# Patient Record
Sex: Female | Born: 1995
Health system: Southern US, Community
[De-identification: ages and names within clinical notes are randomized; demographics above are authoritative.]

## PROBLEM LIST (undated history)

## (undated) DIAGNOSIS — A64 Unspecified sexually transmitted disease: Secondary | ICD-10-CM

---
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
## 2014-01-23 ENCOUNTER — Emergency Department (HOSPITAL_COMMUNITY): Payer: Medicaid Other

## 2014-01-23 ENCOUNTER — Encounter (HOSPITAL_COMMUNITY): Payer: Self-pay | Admitting: Emergency Medicine

## 2014-01-23 ENCOUNTER — Emergency Department (HOSPITAL_COMMUNITY)
Admission: EM | Admit: 2014-01-23 | Discharge: 2014-01-23 | Disposition: A | Discharge status: Home / Self Care | Payer: Medicaid Other | Attending: Emergency Medicine | Admitting: Emergency Medicine

## 2014-01-23 DIAGNOSIS — N83209 Unspecified ovarian cyst, unspecified side: Secondary | ICD-10-CM | POA: Insufficient documentation

## 2014-01-23 DIAGNOSIS — A499 Bacterial infection, unspecified: Secondary | ICD-10-CM | POA: Insufficient documentation

## 2014-01-23 DIAGNOSIS — Z3202 Encounter for pregnancy test, result negative: Secondary | ICD-10-CM | POA: Insufficient documentation

## 2014-01-23 DIAGNOSIS — R197 Diarrhea, unspecified: Secondary | ICD-10-CM | POA: Insufficient documentation

## 2014-01-23 DIAGNOSIS — N760 Acute vaginitis: Secondary | ICD-10-CM | POA: Insufficient documentation

## 2014-01-23 DIAGNOSIS — R1110 Vomiting, unspecified: Secondary | ICD-10-CM | POA: Insufficient documentation

## 2014-01-23 DIAGNOSIS — B9689 Other specified bacterial agents as the cause of diseases classified elsewhere: Secondary | ICD-10-CM | POA: Insufficient documentation

## 2014-01-23 DIAGNOSIS — F17200 Nicotine dependence, unspecified, uncomplicated: Secondary | ICD-10-CM | POA: Insufficient documentation

## 2014-01-23 LAB — BASIC METABOLIC PANEL
BUN: 11 mg/dL (ref 6–23)
CHLORIDE: 101 meq/L (ref 96–112)
CO2: 23 mEq/L (ref 19–32)
Calcium: 9.2 mg/dL (ref 8.4–10.5)
Creatinine, Ser: 0.62 mg/dL (ref 0.50–1.10)
GFR calc non Af Amer: >90 mL/min (ref >90)
Glucose, Bld: 89 mg/dL (ref 70–99)
POTASSIUM: 3.9 meq/L (ref 3.7–5.3)
SODIUM: 138 meq/L (ref 137–147)

## 2014-01-23 LAB — URINALYSIS, ROUTINE W REFLEX MICROSCOPIC
Bilirubin Urine: NEGATIVE
Glucose, UA: NEGATIVE mg/dL
Hgb urine dipstick: NEGATIVE
KETONES UR: NEGATIVE mg/dL
LEUKOCYTES UA: NEGATIVE
NITRITE: NEGATIVE
PH: 7 (ref 5.0–8.0)
Protein, ur: NEGATIVE mg/dL
SPECIFIC GRAVITY, URINE: 1.03 (ref 1.005–1.030)
Urobilinogen, UA: 1 mg/dL (ref 0.0–1.0)

## 2014-01-23 LAB — WET PREP, GENITAL
TRICH WET PREP: NONE SEEN
WBC, Wet Prep HPF POC: NONE SEEN
YEAST WET PREP: NONE SEEN

## 2014-01-23 LAB — CBC WITH DIFFERENTIAL/PLATELET
BASOS PCT: 1 % (ref 0–1)
Basophils Absolute: 0.1 10*3/uL (ref 0.0–0.1)
Eosinophils Absolute: 0.1 10*3/uL (ref 0.0–0.7)
Eosinophils Relative: 1 % (ref 0–5)
HCT: 40 % (ref 36.0–46.0)
HEMOGLOBIN: 13.6 g/dL (ref 12.0–15.0)
Lymphocytes Relative: 11 % — ABNORMAL LOW (ref 12–46)
Lymphs Abs: 1.3 10*3/uL (ref 0.7–4.0)
MCH: 31 pg (ref 26.0–34.0)
MCHC: 34 g/dL (ref 30.0–36.0)
MCV: 91.1 fL (ref 78.0–100.0)
MONOS PCT: 7 % (ref 3–12)
Monocytes Absolute: 0.8 10*3/uL (ref 0.1–1.0)
NEUTROS ABS: 8.9 10*3/uL — AB (ref 1.7–7.7)
NEUTROS PCT: 80 % — AB (ref 43–77)
Platelets: 341 10*3/uL (ref 150–400)
RBC: 4.39 MIL/uL (ref 3.87–5.11)
RDW: 12.8 % (ref 11.5–15.5)
WBC: 11.2 10*3/uL — ABNORMAL HIGH (ref 4.0–10.5)

## 2014-01-23 LAB — POCT PREGNANCY, URINE: Preg Test, Ur: NEGATIVE

## 2014-01-23 MED ORDER — HYDROCODONE-ACETAMINOPHEN 5-325 MG PO TABS: 1.0000 | ORAL_TABLET | ORAL | Status: DC | PRN

## 2014-01-23 MED ORDER — ONDANSETRON HCL 4 MG/2ML IJ SOLN
4.0000 mg | Freq: Once | INTRAMUSCULAR | Status: AC
  Administered 2014-01-23: 4 mg via INTRAVENOUS
  Filled 2014-01-23: qty 2

## 2014-01-23 MED ORDER — IOHEXOL 300 MG/ML  SOLN
100.0000 mL | Freq: Once | INTRAMUSCULAR | Status: AC | PRN
Start: 2014-01-23 — End: 2014-01-23
  Administered 2014-01-23: 100 mL via INTRAVENOUS

## 2014-01-23 MED ORDER — MORPHINE SULFATE 4 MG/ML IJ SOLN
4.0000 mg | Freq: Once | INTRAMUSCULAR | Status: AC
  Administered 2014-01-23: 4 mg via INTRAVENOUS
  Filled 2014-01-23: qty 1

## 2014-01-23 MED ORDER — METRONIDAZOLE 500 MG PO TABS: 500.0000 mg | ORAL_TABLET | Freq: Two times a day (BID) | ORAL | Status: DC

## 2014-01-23 NOTE — ED Provider Notes (Signed)
CSN: [id number]     Arrival date & time 01/23/14  1105 History   First MD Initiated Contact with Patient 01/23/14 1109     Chief Complaint  Patient presents with  . Abdominal Pain     (Consider location/radiation/quality/duration/timing/severity/associated sxs/prior Treatment) HPI Comments: Pt states that she started with llq pain with vomiting and diarrhea onset this morning. No fever. Pt states that he hasn't been able to keep anything down. No previous surgery. Pain is constant. No blood noted in diarrhea  The history is provided by the patient. No language interpreter was used.    Past Medical History  Diagnosis Date  . STD (female)    History reviewed. No pertinent past surgical history. History reviewed. No pertinent family history. History  Substance Use Topics  . Smoking status: Current Every Day Smoker    Types: Cigarettes  . Smokeless tobacco: Not on file  . Alcohol Use: No   OB History   Grav Para Term Preterm Abortions TAB SAB Ect Mult Living                 Review of Systems  Constitutional: Negative.   Respiratory: Negative.   Cardiovascular: Negative.       Allergies  Review of patient's allergies indicates no known allergies.  Home Medications   Current Outpatient Rx  Name  Route  Sig  Dispense  Refill  . loperamide (IMODIUM) 2 MG capsule   Oral   Take 4 mg by mouth as needed for diarrhea or loose stools.          BP 124/66  Pulse 93  Temp(Src) 98 F (36.7 C) (Oral)  Resp 22  Ht 5\' 3"  (1.6 m)  Wt 159 lb 8 oz (72.349 kg)  BMI 28.26 kg/m2  SpO2 98% Physical Exam  Nursing note and vitals reviewed. Constitutional: She is oriented to person, place, and time. She appears well-developed and well-nourished.  Cardiovascular: Normal rate.   Pulmonary/Chest: Effort normal and breath sounds normal.  Abdominal: Soft. Bowel sounds are normal. There is tenderness in the suprapubic area and left lower quadrant.  Genitourinary:  Thick white  discharge:bilateral tenderness  Musculoskeletal: Normal range of motion.  Neurological: She is alert and oriented to person, place, and time.  Skin: Skin is warm and dry.    ED Course  Procedures (including critical care time) Labs Review Labs Reviewed  WET PREP, GENITAL - Abnormal; Notable for the following:    Clue Cells Wet Prep HPF POC MODERATE (*)    All other components within normal limits  URINALYSIS, ROUTINE W REFLEX MICROSCOPIC - Abnormal; Notable for the following:    APPearance HAZY (*)    All other components within normal limits  CBC WITH DIFFERENTIAL - Abnormal; Notable for the following:    WBC 11.2 (*)    Neutrophils Relative % 80 (*)    Neutro Abs 8.9 (*)    Lymphocytes Relative 11 (*)    All other components within normal limits  GC/CHLAMYDIA PROBE AMP  BASIC METABOLIC PANEL  POCT PREGNANCY, URINE   Imaging Review Ct Abdomen Pelvis W Contrast  01/23/2014   CLINICAL DATA:  Left lower quadrant abdominal pain, vomiting and diarrhea  EXAM: CT ABDOMEN AND PELVIS WITH CONTRAST  TECHNIQUE: Multidetector CT imaging of the abdomen and pelvis was performed using the standard protocol following bolus administration of intravenous contrast.  CONTRAST:  OMNIPAQUE IOHEXOL 300 MG/ML  SOLN  COMPARISON:  US ABDOMEN COMPLETE dated 10/06/2009  FINDINGS: Normal  hepatic contour. No discrete hepatic lesions. Normal appearance of the gallbladder. No radiopaque gallstones. No intra or extrahepatic biliary duct dilatation. No ascites.  There is symmetric enhancement and excretion of bilateral kidneys. No definite renal stones on this postcontrast examination. No discrete renal lesions. No urinary destruction or perinephric stranding. Normal appearance of the bilateral adrenal glands, pancreas and spleen.  Ingestion enteric contrast extends to the level of the distal small bowel. No evidence of enteric obstruction. Normal appearance of the retrocecal appendix which is noted to course  cranially about the caudal aspect of the right lobe of the liver. No pneumoperitoneum, pneumatosis or portal venous gas.  Normal caliber the abdominal aorta. The major branch vessels of the abdominal aorta appear patent on this non CT examination. Incidental note is made of an a circum aortic left-sided renal vein. No retroperitoneal, mesenteric, pelvic or inguinal lymphadenopathy.  Note is made of an approximately 1.9 x 1.2 cm hypo attenuating (11 Hounsfield unit) presumed physiologic left-sided adnexal cyst (coronal image 60, series 6). There is a minimal amount of fluid (measuring approximately 10 Hounsfield units within the adjacent left side adnexa and pelvic cul-de-sac (axial image 70, series 12). There is a minimal amount of presumably physiologic fluid within the endometrial canal. No discrete right-sided adnexal lesions.  Limited visualization of the lower thorax demonstrates minimal bibasilar dependent ground-glass atelectasis, right greater than left. There is minimal subsegmental atelectasis from the caudal aspect of the right middle lobe. No discrete focal airspace opacities. No pleural effusion.  Normal heart size.  No pericardial effusion.  No acute or aggressive osseus abnormalities. Regional soft tissues appear normal.  IMPRESSION: 1. Approximately 1.9 cm left-sided adnexal cyst with minimal amount of presumed physiologic fluid within the adjacent left adnexa, pelvic cul-de-sac and endometrial canal. 2. Otherwise, no explanation for patient's left lower abdominal quadrant abdominal pain. Specifically, no evidence of enteric or urinary obstruction. Normal appearance of the appendix.   Electronically Signed   By: Simonne ComeJohn  Watts M.D.   On: 01/23/2014 15:59    EKG Interpretation   None       MDM   Final diagnoses:  BV (bacterial vaginosis)  Ovarian cyst  Vomiting and diarrhea    Pt is comfortable at this time:will treat with something for pain. Pt can follow up with pcp. Pt has not been  seen by gyn. Pt tolerating po without any problem    Teressa LowerVrinda Cobain Morici, NP 01/23/14 [phone]

## 2014-01-23 NOTE — ED Provider Notes (Signed)
Medical screening examination/treatment/procedure(s) were performed by non-physician practitioner and as supervising physician I was immediately available for consultation/collaboration.  EKG Interpretation   None         Shon Batonourtney F Tiah Heckel, MD 01/23/14 [phone]

## 2014-01-23 NOTE — ED Notes (Signed)
Pt completed oral contrast. CT notified

## 2014-01-23 NOTE — ED Notes (Signed)
Pt in c/o left lower abd pain that started this am with vomiting and diarrhea, pt tearful in triage

## 2014-01-23 NOTE — Discharge Instructions (Signed)
Bacterial Vaginosis Bacterial vaginosis is a vaginal infection that occurs when the normal balance of bacteria in the vagina is disrupted. It results from an overgrowth of certain bacteria. This is the most common vaginal infection in women of childbearing age. Treatment is important to prevent complications, especially in pregnant women, as it can cause a premature delivery. CAUSES  Bacterial vaginosis is caused by an increase in harmful bacteria that are normally present in smaller amounts in the vagina. Several different kinds of bacteria can cause bacterial vaginosis. However, the reason that the condition develops is not fully understood. RISK FACTORS Certain activities or behaviors can put you at an increased risk of developing bacterial vaginosis, including:  Having a new sex partner or multiple sex partners.  Douching.  Using an intrauterine device (IUD) for contraception. Women do not get bacterial vaginosis from toilet seats, bedding, swimming pools, or contact with objects around them. SIGNS AND SYMPTOMS  Some women with bacterial vaginosis have no signs or symptoms. Common symptoms include:  Grey vaginal discharge.  A fishlike odor with discharge, especially after sexual intercourse.  Itching or burning of the vagina and vulva.  Burning or pain with urination. DIAGNOSIS  Your health care provider will take a medical history and examine the vagina for signs of bacterial vaginosis. A sample of vaginal fluid may be taken. Your health care provider will look at this sample under a microscope to check for bacteria and abnormal cells. A vaginal pH test may also be done.  TREATMENT  Bacterial vaginosis may be treated with antibiotic medicines. These may be given in the form of a pill or a vaginal cream. A second round of antibiotics may be prescribed if the condition comes back after treatment.  HOME CARE INSTRUCTIONS   Only take over-the-counter or prescription medicines as  directed by your health care provider.  If antibiotic medicine was prescribed, take it as directed. Make sure you finish it even if you start to feel better.  Do not have sex until treatment is completed.  Tell all sexual partners that you have a vaginal infection. They should see their health care provider and be treated if they have problems, such as a mild rash or itching.  Practice safe sex by using condoms and only having one sex partner. SEEK MEDICAL CARE IF:   Your symptoms are not improving after 3 days of treatment.  You have increased discharge or pain.  You have a fever. MAKE SURE YOU:   Understand these instructions.  Will watch your condition.  Will get help right away if you are not doing well or get worse. FOR MORE INFORMATION  Centers for Disease Control and Prevention, Division of STD Prevention: SolutionApps.co.za American Sexual Health Association (ASHA): www.ashastd.org  Document Released: 11/26/2005 Document Revised: 09/16/2013 Document Reviewed: 07/08/2013 [facility] Patient Information 2014 [place], [place].  Ovarian Cyst An ovarian cyst is a sac filled with fluid or blood. This sac is attached to the ovary. Some cysts go away on their own. Other cysts need treatment.  HOME CARE   Only take medicine as told by your doctor.  Follow up with your doctor as told.  Get regular pelvic exams and Pap tests. GET HELP IF:  Your periods are late, not regular, or painful.  You stop having periods.  Your belly (abdominal) or pelvic pain does not go away.  Your belly becomes large or puffy (swollen).  You have a hard time peeing (totally emptying your bladder).  You have pressure on your  bladder.  You have pain during sex.  You feel fullness, pressure, or discomfort in your belly.  You lose weight for no reason.  You feel sick most of the time.  You have a hard time pooping (constipation).  You do not feel like eating.  You develop pimples  (acne).  You have an increase in hair on your body and face.  You are gaining weight for no reason.  You think you are pregnant. GET HELP RIGHT AWAY IF:   Your belly pain gets worse.  You feel sick to your stomach (nauseous), and you throw up (vomit).  You have a fever that comes on fast.  You have belly pain while pooping (bowel movement).  Your periods are heavier than usual. MAKE SURE YOU:   Understand these instructions.  Will watch your condition.  Will get help right away if you are not doing well or get worse. Document Released: 05/14/2008 Document Revised: 09/16/2013 Document Reviewed: 08/03/2013 [facility] Patient Information 2014 [place], [place].

## 2014-01-23 NOTE — ED Notes (Signed)
NP at bedside.

## 2014-01-25 LAB — GC/CHLAMYDIA PROBE AMP
CT Probe RNA: POSITIVE — AB
GC Probe RNA: POSITIVE — AB

## 2014-01-26 NOTE — ED Notes (Signed)
Chart sent to EDP office for review.+ Gonorrhea +Chlamydia

## 2014-01-28 NOTE — ED Notes (Signed)
Chart returned from EDP office  Rocephin 250 mg IM once as treatment for Gonorrhea Zithromax 1,000 mg po once as treatment for Chalmydia

## 2014-08-12 ENCOUNTER — Emergency Department (HOSPITAL_BASED_OUTPATIENT_CLINIC_OR_DEPARTMENT_OTHER)
Admission: EM | Admit: 2014-08-12 | Discharge: 2014-08-13 | Disposition: A | Discharge status: Home / Self Care | Payer: Medicaid Other | Attending: Emergency Medicine | Admitting: Emergency Medicine

## 2014-08-12 ENCOUNTER — Emergency Department (HOSPITAL_BASED_OUTPATIENT_CLINIC_OR_DEPARTMENT_OTHER): Payer: Medicaid Other

## 2014-08-12 ENCOUNTER — Encounter (HOSPITAL_BASED_OUTPATIENT_CLINIC_OR_DEPARTMENT_OTHER): Payer: Self-pay | Admitting: Emergency Medicine

## 2014-08-12 DIAGNOSIS — F17200 Nicotine dependence, unspecified, uncomplicated: Secondary | ICD-10-CM | POA: Insufficient documentation

## 2014-08-12 DIAGNOSIS — J069 Acute upper respiratory infection, unspecified: Secondary | ICD-10-CM | POA: Insufficient documentation

## 2014-08-12 DIAGNOSIS — R05 Cough: Secondary | ICD-10-CM | POA: Diagnosis present

## 2014-08-12 DIAGNOSIS — R059 Cough, unspecified: Secondary | ICD-10-CM | POA: Diagnosis present

## 2014-08-12 DIAGNOSIS — L509 Urticaria, unspecified: Secondary | ICD-10-CM | POA: Diagnosis not present

## 2014-08-12 DIAGNOSIS — Z8619 Personal history of other infectious and parasitic diseases: Secondary | ICD-10-CM | POA: Insufficient documentation

## 2014-08-12 DIAGNOSIS — Z79899 Other long term (current) drug therapy: Secondary | ICD-10-CM | POA: Insufficient documentation

## 2014-08-12 MED ORDER — ALBUTEROL SULFATE HFA 108 (90 BASE) MCG/ACT IN AERS
2.0000 | INHALATION_SPRAY | Freq: Once | RESPIRATORY_TRACT | Status: AC
  Administered 2014-08-12: 2 via RESPIRATORY_TRACT
  Filled 2014-08-12: qty 6.7

## 2014-08-12 NOTE — ED Provider Notes (Signed)
CSN: [id number]     Arrival date & time 08/12/14  2202 History   First MD Initiated Contact with Patient 08/12/14 2337     This chart was scribed for Joya Gaskins, MD by Arlan Organ, ED Scribe. This patient was seen in room MH09/MH09 and the patient's care was started 11:40 PM.   Chief Complaint  Patient presents with  . Cough  . Urticaria   The history is provided by the patient. No language interpreter was used.    HPI Comments: Chantella Creech is a 18 y.o. female who presents to the Emergency Department complaining of a constant, moderate  productive cough and nasal congestion x 2 days that is unchanged . Pt also reports hives to forearms that have been responsive to Benadryl. She has tried OTC medications for cold symptoms without any improvement for symptoms. She denies any SOB, fever, or chills. No history of asthma. No known allergies to medications. No other concerns this visit.  Past Medical History  Diagnosis Date  . STD (female)    History reviewed. No pertinent past surgical history. History reviewed. No pertinent family history. History  Substance Use Topics  . Smoking status: Current Every Day Smoker    Types: Cigarettes  . Smokeless tobacco: Not on file  . Alcohol Use: No   OB History   Grav Para Term Preterm Abortions TAB SAB Ect Mult Living                 Review of Systems  Constitutional: Negative for fever and chills.  HENT: Positive for congestion and rhinorrhea.   Respiratory: Positive for cough.   Gastrointestinal: Negative for nausea, vomiting and diarrhea.  All other systems reviewed and are negative.     Allergies  Review of patient's allergies indicates no known allergies.  Home Medications   Prior to Admission medications   Medication Sig Start Date End Date Taking? Authorizing Provider  HYDROcodone-acetaminophen (NORCO/VICODIN) 5-325 MG per tablet Take 1-2 tablets by mouth every 4 (four) hours as needed. 01/23/14   Teressa Lower, NP  loperamide (IMODIUM) 2 MG capsule Take 4 mg by mouth as needed for diarrhea or loose stools.    Historical Provider, MD  metroNIDAZOLE (FLAGYL) 500 MG tablet Take 1 tablet (500 mg total) by mouth 2 (two) times daily. 01/23/14   Teressa Lower, NP   Triage Vitals: BP 123/70  Pulse 96  Temp(Src) 98.4 F (36.9 C) (Oral)  Resp 20  Ht  (1.6 m)  Wt 161 lb (73.029 kg)  BMI 28.53 kg/m2  SpO2 98%  LMP 08/11/2014   Physical Exam  CONSTITUTIONAL: Well developed/well nourished HEAD: Normocephalic/atraumatic EYES: EOMI/PERRL ENMT: Mucous membranes moist NECK: supple no meningeal signs SPINE:entire spine nontender CV: S1/S2 noted, no murmurs/rubs/gallops noted LUNGS: Lungs are clear to auscultation bilaterally, no apparent distress ABDOMEN: soft, nontender, no rebound or guarding NEURO: Pt is awake/alert, moves all extremitiesx4 EXTREMITIES: pulses normal, full ROM SKIN: warm, color normal. No rash noted PSYCH: no abnormalities of mood noted   ED Course  Procedures   DIAGNOSTIC STUDIES: Oxygen Saturation is 98% on RA, Normal by my interpretation.    COORDINATION OF CARE: 11:38 PM- Will order DG chest 2 view. Will give albuterol. Discussed treatment plan with pt at bedside and pt agreed to plan.    Imaging Review Dg Chest 2 View  08/12/2014   CLINICAL DATA:  Cough, congestion.  EXAM: CHEST  2 VIEW  COMPARISON:  None.  FINDINGS: The heart size and  mediastinal contours are within normal limits. Both lungs are clear. The visualized skeletal structures are unremarkable.  IMPRESSION: No radiographic evidence of active cardiopulmonary disease.   Electronically Signed   By: Jearld Lesch M.D.   On: 08/12/2014 22:55      MDM   Final diagnoses:  URI (upper respiratory infection)    Nursing notes including past medical history and social history reviewed and considered in documentation xrays reviewed and considered   I personally performed the services described  in this documentation, which was scribed in my presence. The recorded information has been reviewed and is accurate.    Joya Gaskins, MD 08/13/14 [phone]

## 2014-08-12 NOTE — Discharge Instructions (Signed)
Cough, Adult   A cough is a reflex. It helps you clear your throat and airways. A cough can help heal your body. A cough can last 2 or 3 weeks (acute) or may last more than 8 weeks (chronic). Some common causes of a cough can include an infection, allergy, or a cold.  HOME CARE  · Only take medicine as told by your doctor.  · If given, take your medicines (antibiotics) as told. Finish them even if you start to feel better.  · Use a cold steam vaporizer or humidifier in your home. This can help loosen thick spit (secretions).  · Sleep so you are almost sitting up (semi-upright). Use pillows to do this. This helps reduce coughing.  · Rest as needed.  · Stop smoking if you smoke.  GET HELP RIGHT AWAY IF:  · You have yellowish-white fluid (pus) in your thick spit.  · Your cough gets worse.  · Your medicine does not reduce coughing, and you are losing sleep.  · You cough up blood.  · You have trouble breathing.  · Your pain gets worse and medicine does not help.  · You have a fever.  MAKE SURE YOU:   · Understand these instructions.  · Will watch your condition.  · Will get help right away if you are not doing well or get worse.  Document Released: 08/09/2011 Document Revised: 04/12/2014 Document Reviewed: 08/09/2011  ExitCare® Patient Information ©2015 ExitCare, LLC. This information is not intended to replace advice given to you by your health care provider. Make sure you discuss any questions you have with your health care provider.

## 2014-08-12 NOTE — ED Notes (Signed)
Patient states that she started to have a cough and rash yesterday after another family members came home from school sick

## 2014-08-22 ENCOUNTER — Emergency Department (HOSPITAL_COMMUNITY)
Admission: EM | Admit: 2014-08-22 | Discharge: 2014-08-22 | Disposition: A | Discharge status: Home / Self Care | Payer: Medicaid Other | Attending: Emergency Medicine | Admitting: Emergency Medicine

## 2014-08-22 ENCOUNTER — Encounter (HOSPITAL_COMMUNITY): Payer: Self-pay | Admitting: Emergency Medicine

## 2014-08-22 DIAGNOSIS — B379 Candidiasis, unspecified: Secondary | ICD-10-CM

## 2014-08-22 DIAGNOSIS — B373 Candidiasis of vulva and vagina: Secondary | ICD-10-CM | POA: Diagnosis not present

## 2014-08-22 DIAGNOSIS — Z3202 Encounter for pregnancy test, result negative: Secondary | ICD-10-CM | POA: Insufficient documentation

## 2014-08-22 DIAGNOSIS — N760 Acute vaginitis: Secondary | ICD-10-CM | POA: Diagnosis not present

## 2014-08-22 DIAGNOSIS — F17200 Nicotine dependence, unspecified, uncomplicated: Secondary | ICD-10-CM | POA: Diagnosis not present

## 2014-08-22 DIAGNOSIS — B9689 Other specified bacterial agents as the cause of diseases classified elsewhere: Secondary | ICD-10-CM

## 2014-08-22 DIAGNOSIS — L293 Anogenital pruritus, unspecified: Secondary | ICD-10-CM | POA: Insufficient documentation

## 2014-08-22 DIAGNOSIS — B3731 Acute candidiasis of vulva and vagina: Secondary | ICD-10-CM | POA: Diagnosis not present

## 2014-08-22 LAB — URINALYSIS, ROUTINE W REFLEX MICROSCOPIC
BILIRUBIN URINE: NEGATIVE
GLUCOSE, UA: NEGATIVE mg/dL
HGB URINE DIPSTICK: NEGATIVE
KETONES UR: NEGATIVE mg/dL
Nitrite: NEGATIVE
PH: 7 (ref 5.0–8.0)
Protein, ur: NEGATIVE mg/dL
Specific Gravity, Urine: 1.005 (ref 1.005–1.030)
Urobilinogen, UA: 0.2 mg/dL (ref 0.0–1.0)

## 2014-08-22 LAB — WET PREP, GENITAL: Trich, Wet Prep: NONE SEEN

## 2014-08-22 LAB — URINE MICROSCOPIC-ADD ON

## 2014-08-22 LAB — POC URINE PREG, ED: Preg Test, Ur: NEGATIVE

## 2014-08-22 MED ORDER — FLUCONAZOLE 200 MG PO TABS
200.0000 mg | ORAL_TABLET | Freq: Once | ORAL | Status: AC
  Administered 2014-08-22: 200 mg via ORAL
  Filled 2014-08-22: qty 1

## 2014-08-22 MED ORDER — FLUCONAZOLE 200 MG PO TABS: 200.0000 mg | ORAL_TABLET | Freq: Once | ORAL | Status: AC

## 2014-08-22 MED ORDER — METRONIDAZOLE 500 MG PO TABS: 500.0000 mg | ORAL_TABLET | Freq: Two times a day (BID) | ORAL | Status: DC

## 2014-08-22 NOTE — Discharge Instructions (Signed)
Take the prescribed medication as directed.  Do not drink alcohol while taking Flagyl, it will make you sick/vomit. You will be notified in 24/48 hours if culture results are positive or require treatment. Follow-up with women's clinic if symptoms persist. Return to the ED for new or worsening symptoms.

## 2014-08-22 NOTE — ED Notes (Signed)
Patient c/o vaginal itching. Patient reports increased urination. Denies vaginal discharge. Patient reports a history of unprotected sex. Requesting STD check.

## 2014-08-22 NOTE — ED Notes (Signed)
Patient is alert and oriented x3.  She was given DC instructions and follow up visit instructions.  Patient gave verbal understanding. She was DC ambulatory under her own power to home.  V/S stable.  He was not showing any signs of distress on DC 

## 2014-08-22 NOTE — ED Provider Notes (Signed)
Medical screening examination/treatment/procedure(s) were performed by non-physician practitioner and as supervising physician I was immediately available for consultation/collaboration.   EKG Interpretation None        Dorla Guizar F Byrd Terrero, MD 08/22/14 0710 

## 2014-08-22 NOTE — ED Provider Notes (Signed)
CSN: [id number]     Arrival date & time 08/22/14  0106 History   First MD Initiated Contact with Patient 08/22/14 0115     Chief Complaint  Patient presents with  . Vaginal Itching  . Exposure to STD    possible     (Consider location/radiation/quality/duration/timing/severity/associated sxs/prior Treatment) Patient is a 18 y.o. female presenting with vaginal itching and STD exposure. The history is provided by the patient and medical records.  Vaginal Itching  Exposure to STD  This is a 18 year old female with her my history of STD, presenting to the ED for vaginal itching. Patient states symptoms started yesterday, but worsened throughout the night making it hard for her to go to sleep. She denies any vaginal discharge, pelvic pain, or abdominal pain. She does note increased urination, but denies dysuria or urinary frequency. Patient is currently monogamous with one female sexual partner, they have been using condoms for protection however she does request an STD check.  No fever, chills, sweats, or rashes.  Past Medical History  Diagnosis Date  . STD (female)    History reviewed. No pertinent past surgical history. No family history on file. History  Substance Use Topics  . Smoking status: Current Every Day Smoker -- 0.25 packs/day    Types: Cigarettes  . Smokeless tobacco: Not on file  . Alcohol Use: No   OB History   Grav Para Term Preterm Abortions TAB SAB Ect Mult Living                 Review of Systems  Genitourinary: Positive for vaginal pain (itching).  All other systems reviewed and are negative.     Allergies  Review of patient's allergies indicates no known allergies.  Home Medications   Prior to Admission medications   Not on File   BP 138/75  Pulse 91  Temp(Src) 98 F (36.7 C) (Oral)  Resp 16  Ht  (1.6 m)  Wt 158 lb (71.668 kg)  BMI 28.00 kg/m2  SpO2 100%  LMP 08/08/2014  Physical Exam  Nursing note and vitals  reviewed. Constitutional: She is oriented to person, place, and time. She appears well-developed and well-nourished.  HENT:  Head: Normocephalic and atraumatic.  Mouth/Throat: Oropharynx is clear and moist.  Eyes: Conjunctivae and EOM are normal. Pupils are equal, round, and reactive to light.  Neck: Normal range of motion.  Cardiovascular: Normal rate, regular rhythm and normal heart sounds.   Pulmonary/Chest: Effort normal and breath sounds normal. No respiratory distress. She has no wheezes.  Abdominal: Soft. Bowel sounds are normal. There is no tenderness. There is no guarding.  Genitourinary: There is no tenderness or lesion on the right labia. There is no tenderness or lesion on the left labia. Cervix exhibits no motion tenderness. Right adnexum displays no tenderness. Left adnexum displays no tenderness. No bleeding around the vagina. No foreign body around the vagina. Vaginal discharge found.  Normal female external genitalia without visible lesions; moderate about of thick, curd like vaginal discharge present in vaginal vault consistent with yeast infection; cervical os closed; no adnexal or CMT  Musculoskeletal: Normal range of motion.  Neurological: She is alert and oriented to person, place, and time.  Skin: Skin is warm and dry.  Psychiatric: She has a normal mood and affect.    ED Course  Procedures (including critical care time) Labs Review Labs Reviewed  WET PREP, GENITAL - Abnormal; Notable for the following:    Yeast Wet Prep HPF POC MANY (*)  Clue Cells Wet Prep HPF POC MANY (*)    WBC, Wet Prep HPF POC MANY (*)    All other components within normal limits  URINALYSIS, ROUTINE W REFLEX MICROSCOPIC - Abnormal; Notable for the following:    APPearance CLOUDY (*)    Leukocytes, UA LARGE (*)    All other components within normal limits  URINE MICROSCOPIC-ADD ON - Abnormal; Notable for the following:    Squamous Epithelial / LPF FEW (*)    Bacteria, UA FEW (*)    All  other components within normal limits  GC/CHLAMYDIA PROBE AMP  POC URINE PREG, ED    Imaging Review No results found.   EKG Interpretation None      MDM   Final diagnoses:  Yeast infection  Bacterial vaginosis   18 year old female with vaginal itching. She denies any discharge. No abdominal pain, pelvic pain, fever, or chills. Increased urinary frequency without dysuria or hematuria. Currently monogamous with one female sexual partner, using condoms for protection.  On exam, patient afebrile and non-toxic appearing.  Abdominal exam is benign.  Pelvic exam with moderate amount of thick, curd-like vaginal discharge consistent with yeast infection. No adnexal or cervical motion tenderness. No masses or fullness appreciated.  Wet prep with many yeast and many clue cells. Gc/Chl pending. Patient will be treated with Diflucan and Flagyl. She was encouraged not to drink alcohol while taking Flagyl. She'll followup with women's clinic if symptoms persist.  Discussed plan with patient, he/she acknowledged understanding and agreed with plan of care.  Return precautions given for new or worsening symptoms.  Garlon Hatchet, PA-C 08/22/14 [phone]

## 2014-08-23 LAB — GC/CHLAMYDIA PROBE AMP
CT PROBE, AMP APTIMA: NEGATIVE
GC PROBE AMP APTIMA: NEGATIVE

## 2015-05-02 ENCOUNTER — Encounter (HOSPITAL_BASED_OUTPATIENT_CLINIC_OR_DEPARTMENT_OTHER): Payer: Self-pay | Admitting: Family Medicine

## 2015-05-02 ENCOUNTER — Emergency Department (HOSPITAL_BASED_OUTPATIENT_CLINIC_OR_DEPARTMENT_OTHER)
Admission: EM | Admit: 2015-05-02 | Discharge: 2015-05-02 | Disposition: A | Discharge status: Home / Self Care | Payer: Medicaid Other | Attending: Emergency Medicine | Admitting: Emergency Medicine

## 2015-05-02 DIAGNOSIS — Z8619 Personal history of other infectious and parasitic diseases: Secondary | ICD-10-CM | POA: Insufficient documentation

## 2015-05-02 DIAGNOSIS — Y998 Other external cause status: Secondary | ICD-10-CM | POA: Insufficient documentation

## 2015-05-02 DIAGNOSIS — S01312A Laceration without foreign body of left ear, initial encounter: Secondary | ICD-10-CM | POA: Insufficient documentation

## 2015-05-02 DIAGNOSIS — S0101XA Laceration without foreign body of scalp, initial encounter: Secondary | ICD-10-CM

## 2015-05-02 DIAGNOSIS — S0990XA Unspecified injury of head, initial encounter: Secondary | ICD-10-CM | POA: Insufficient documentation

## 2015-05-02 DIAGNOSIS — X58XXXA Exposure to other specified factors, initial encounter: Secondary | ICD-10-CM | POA: Insufficient documentation

## 2015-05-02 DIAGNOSIS — Z792 Long term (current) use of antibiotics: Secondary | ICD-10-CM | POA: Insufficient documentation

## 2015-05-02 DIAGNOSIS — Z720 Tobacco use: Secondary | ICD-10-CM | POA: Insufficient documentation

## 2015-05-02 DIAGNOSIS — S6991XA Unspecified injury of right wrist, hand and finger(s), initial encounter: Secondary | ICD-10-CM | POA: Insufficient documentation

## 2015-05-02 DIAGNOSIS — Y939 Activity, unspecified: Secondary | ICD-10-CM | POA: Insufficient documentation

## 2015-05-02 DIAGNOSIS — Y929 Unspecified place or not applicable: Secondary | ICD-10-CM | POA: Insufficient documentation

## 2015-05-02 DIAGNOSIS — M79644 Pain in right finger(s): Secondary | ICD-10-CM

## 2015-05-02 NOTE — Discharge Instructions (Signed)
Buddy Taping You have a minor finger or toe injury. It can be managed by buddy taping. Buddy taping means the injured finger or toe is taped to a healthy uninjured adjacent finger or toe. Most minor fractures and dislocations of the smaller fingers and toes will heal in 3 to 4 weeks. Buddy taping immobilizes and protects the area of injury. Buddy taping is not recommended for initial treatment of fractures of the thumb, longer fingers, or the great toe. Buddy taping should not be used for unstable or deformed fractures, but as fracture healing progresses it may be used for protection during rehabilitation. Fractured fingers and toes should be protected by buddy taping as long as the injury is still painful or swollen.  When an injury is buddy taped, place a small piece of gauze or cotton between the digits that are taped. This helps prevent the skin from breaking down from increased moisture. Buddy taping allows you to get your injury wet when you bathe. Change the gauze and tape more often if it gets wet, and dry the space between the finger or toes. Use a sturdy, hard-soled shoe for better support if you have a fractured toe. In 2 to 3 weeks you can start motion exercises. This will keep the fingers or toes from becoming stiff.  SEEK IMMEDIATE MEDICAL CARE IF:   The injured area becomes cold, numb, or pale.  You have pain not controlled with medications.  You notice increasing deformity of the toe or finger. Document Released: 01/03/2005 Document Revised: 02/18/2012 Document Reviewed: 05/04/2009 [facility] Patient Information 2015 [place], [place]. This information is not intended to replace advice given to you by your health care provider. Make sure you discuss any questions you have with your health care provider. Tissue Adhesive Wound Care Some cuts, wounds, lacerations, and incisions can be repaired by using tissue adhesive. Tissue adhesive is like glue. It holds the skin together, allowing for  faster healing. It forms a strong bond on the skin in about 1 minute and reaches its full strength in about 2 or 3 minutes. The adhesive disappears naturally while the wound is healing. It is important to take proper care of your wound at home while it heals.  HOME CARE INSTRUCTIONS   Showers are allowed. Do not soak the area containing the tissue adhesive. Do not take baths, swim, or use hot tubs. Do not use any soaps or ointments on the wound. Certain ointments can weaken the glue.  If a bandage (dressing) has been applied, follow your health care provider's instructions for how often to change the dressing.   Keep the dressing dry if one has been applied.   Do not scratch, pick, or rub the adhesive.   Do not place tape over the adhesive. The adhesive could come off when pulling the tape off.   Protect the wound from further injury until it is healed.   Protect the wound from sun and tanning bed exposure while it is healing and for several weeks after healing.   Only take over-the-counter or prescription medicines as directed by your health care provider.   Keep all follow-up appointments as directed by your health care provider. SEEK IMMEDIATE MEDICAL CARE IF:   Your wound becomes red, swollen, hot, or tender.   You develop a rash after the glue is applied.  You have increasing pain in the wound.   You have a red streak that goes away from the wound.   You have pus coming from the  wound.   You have increased bleeding.  You have a fever.  You have shaking chills.   You notice a bad smell coming from the wound.   Your wound or adhesive breaks open.  MAKE SURE YOU:   Understand these instructions.  Will watch your condition.  Will get help right away if you are not doing well or get worse. Document Released: 05/22/2001 Document Revised: 09/16/2013 Document Reviewed: 06/17/2013 [facility] Patient Information 2015 [place], [place]. This information is not  intended to replace advice given to you by your health care provider. Make sure you discuss any questions you have with your health care provider.
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
## 2015-05-02 NOTE — ED Provider Notes (Signed)
CSN: [id number]     Arrival date & time 05/02/15  [id number] History   First MD Initiated Contact with Patient 05/02/15 [phone]     Chief Complaint  Patient presents with  . Ear Laceration      HPI Patient reports or call intoxication last night.  She woke with a small laceration behind the left year.  She also presents with complaints of mild right-sided facial pain.  No headache at this time.  No trismus or malocclusion.  No jaw pain.  No eye complaints.  She also reports mild pain in her right little finger at the PIP joint with associated pain with flexion of the PIP joint.  Mild swelling noted.  No other complaints at this time.  No chest pain shortness breath.  No abdominal pain.   Past Medical History  Diagnosis Date  . STD (female)    History reviewed. No pertinent past surgical history. No family history on file. History  Substance Use Topics  . Smoking status: Current Every Day Smoker -- 0.25 packs/day    Types: Cigarettes  . Smokeless tobacco: Not on file  . Alcohol Use: Yes   OB History    No data available     Review of Systems  All other systems reviewed and are negative.     Allergies  Review of patient's allergies indicates no known allergies.  Home Medications   Prior to Admission medications   Medication Sig Start Date End Date Taking? Authorizing Provider  metroNIDAZOLE (FLAGYL) 500 MG tablet Take 1 tablet (500 mg total) by mouth 2 (two) times daily. 08/22/14   Garlon HatchetLisa M Sanders, PA-C   BP 124/73 mmHg  Pulse 90  Temp(Src) 98.5 F (36.9 C) (Oral)  Resp 18  Ht 5\' 3"  (1.6 m)  Wt 175 lb (79.379 kg)  BMI 31.01 kg/m2  SpO2 100%  LMP 03/30/2015 (Approximate) Physical Exam  Constitutional: She is oriented to person, place, and time. She appears well-developed and well-nourished.  HENT:  Small 1.5 cm laceration behind her left ear in the posterior radicular region.  No active bleeding.  Tissue adhesive applied.  Mild right temporal tenderness without swelling or  bruising.  I started her movements are intact.  No trismus or malocclusion.  Dentition is normal.  No tenderness over her nasal bridge or swelling of her nasal bridge noted.  No tenderness over her right second metacarpal.  Eyes: EOM are normal.  Neck: Normal range of motion.  Pulmonary/Chest: Effort normal.  Abdominal: She exhibits no distension.  Musculoskeletal: Normal range of motion.  Mild swelling and pain with range of motion of the right little finger PIP joint.  Able to flex and extend the right finger normally.  No signs of infection.  Neurological: She is alert and oriented to person, place, and time.  Psychiatric: She has a normal mood and affect.  Nursing note and vitals reviewed.   ED Course  Procedures (including critical care time)  LACERATION REPAIR Performed by: Lyanne CoAMPOS,Jaxsyn Catalfamo M Consent: Verbal consent obtained. Risks and benefits: risks, benefits and alternatives were discussed Patient identity confirmed: provided demographic data Time out performed prior to procedure Prepped and Draped in normal sterile fashion Wound explored Laceration Location: left posterior auricular region Laceration Length: 1.5cm No Foreign Bodies seen or palpated Anesthesia:none Amount of cleaning: standard Skin closure: Tissue adhesive  Number of sutures or staples: Tissue adhesive  Technique: Tissue adhesive  Patient tolerance: Patient tolerated the procedure well with no immediate complications.  SPLINT APPLICATION Authorized by:  Alean Kromer M Consent: Verbal consent obtained. Risks and benefits: risks, benefits and alternatives were discussed Consent given by: patient Splint applied by: myself Location details: right little finger Splint type: buddy tape Supplies used: athletic tape Post-procedure: The splinted body part was neurovascularly unchanged following the procedure. Patient tolerance: Patient tolerated the procedure well with no immediate complications.      Labs  Review Labs Reviewed - No data to display  Imaging Review No results found.   EKG Interpretation None      MDM   Final diagnoses:  Scalp laceration, initial encounter  Finger pain, right  Minor head injury, initial encounter    Laceration repaired.  Buddy taping of the right little finger.  Likely represent small avulsion off of her proximal or middle phalanx of her right little finger.  No indication for x-ray.  Simple buddy taping should resolve and improve symptoms.  No indication for imaging of the head.  Reason anticoagulant.    Azalia Bilis, MD 05/02/15 [phone]

## 2015-05-17 ENCOUNTER — Emergency Department (HOSPITAL_COMMUNITY)
Admission: EM | Admit: 2015-05-17 | Discharge: 2015-05-17 | Disposition: A | Discharge status: Home / Self Care | Payer: Medicaid Other | Attending: Emergency Medicine | Admitting: Emergency Medicine

## 2015-05-17 ENCOUNTER — Encounter (HOSPITAL_COMMUNITY): Payer: Self-pay | Admitting: Physical Medicine and Rehabilitation

## 2015-05-17 ENCOUNTER — Emergency Department (HOSPITAL_COMMUNITY): Payer: Medicaid Other

## 2015-05-17 DIAGNOSIS — Z720 Tobacco use: Secondary | ICD-10-CM | POA: Insufficient documentation

## 2015-05-17 DIAGNOSIS — Z3202 Encounter for pregnancy test, result negative: Secondary | ICD-10-CM | POA: Insufficient documentation

## 2015-05-17 DIAGNOSIS — R110 Nausea: Secondary | ICD-10-CM | POA: Insufficient documentation

## 2015-05-17 DIAGNOSIS — Z792 Long term (current) use of antibiotics: Secondary | ICD-10-CM | POA: Insufficient documentation

## 2015-05-17 DIAGNOSIS — R42 Dizziness and giddiness: Secondary | ICD-10-CM | POA: Insufficient documentation

## 2015-05-17 DIAGNOSIS — R102 Pelvic and perineal pain: Secondary | ICD-10-CM | POA: Insufficient documentation

## 2015-05-17 LAB — COMPREHENSIVE METABOLIC PANEL
ALBUMIN: 4.1 g/dL (ref 3.5–5.0)
ALT: 17 U/L (ref 14–54)
ANION GAP: 8 (ref 5–15)
AST: 23 U/L (ref 15–41)
Alkaline Phosphatase: 51 U/L (ref 38–126)
CO2: 26 mmol/L (ref 22–32)
CREATININE: 0.95 mg/dL (ref 0.44–1.00)
Calcium: 9 mg/dL (ref 8.9–10.3)
Chloride: 105 mmol/L (ref 101–111)
GFR calc Af Amer: >60 mL/min (ref >60)
GFR calc non Af Amer: >60 mL/min (ref >60)
Glucose, Bld: 95 mg/dL (ref 65–99)
Potassium: 4.9 mmol/L (ref 3.5–5.1)
Sodium: 139 mmol/L (ref 135–145)
TOTAL PROTEIN: 6.9 g/dL (ref 6.5–8.1)
Total Bilirubin: 0.4 mg/dL (ref 0.3–1.2)

## 2015-05-17 LAB — URINALYSIS, ROUTINE W REFLEX MICROSCOPIC
Glucose, UA: NEGATIVE mg/dL
Hgb urine dipstick: NEGATIVE
Ketones, ur: 15 mg/dL — AB
Nitrite: NEGATIVE
PH: 6.5 (ref 5.0–8.0)
Protein, ur: NEGATIVE mg/dL
Specific Gravity, Urine: 1.037 — ABNORMAL HIGH (ref 1.005–1.030)
Urobilinogen, UA: 1 mg/dL (ref 0.0–1.0)

## 2015-05-17 LAB — CBC WITH DIFFERENTIAL/PLATELET
BASOS ABS: 0 10*3/uL (ref 0.0–0.1)
BASOS PCT: 1 % (ref 0–1)
Eosinophils Absolute: 0.1 10*3/uL (ref 0.0–0.7)
Eosinophils Relative: 2 % (ref 0–5)
HCT: 37.9 % (ref 36.0–46.0)
Hemoglobin: 12.8 g/dL (ref 12.0–15.0)
LYMPHS ABS: 1.5 10*3/uL (ref 0.7–4.0)
Lymphocytes Relative: 22 % (ref 12–46)
MCH: 30.8 pg (ref 26.0–34.0)
MCHC: 33.8 g/dL (ref 30.0–36.0)
MCV: 91.1 fL (ref 78.0–100.0)
MONOS PCT: 11 % (ref 3–12)
Monocytes Absolute: 0.7 10*3/uL (ref 0.1–1.0)
NEUTROS ABS: 4.6 10*3/uL (ref 1.7–7.7)
Neutrophils Relative %: 64 % (ref 43–77)
PLATELETS: 333 10*3/uL (ref 150–400)
RBC: 4.16 MIL/uL (ref 3.87–5.11)
RDW: 12.8 % (ref 11.5–15.5)
WBC: 7 10*3/uL (ref 4.0–10.5)

## 2015-05-17 LAB — WET PREP, GENITAL
Trich, Wet Prep: NONE SEEN
Yeast Wet Prep HPF POC: NONE SEEN

## 2015-05-17 LAB — LIPASE, BLOOD: Lipase: 16 U/L — ABNORMAL LOW (ref 22–51)

## 2015-05-17 LAB — URINE MICROSCOPIC-ADD ON

## 2015-05-17 LAB — POC URINE PREG, ED: Preg Test, Ur: NEGATIVE

## 2015-05-17 MED ORDER — KETOROLAC TROMETHAMINE 60 MG/2ML IM SOLN
30.0000 mg | Freq: Once | INTRAMUSCULAR | Status: AC
  Administered 2015-05-17: 30 mg via INTRAMUSCULAR
  Filled 2015-05-17: qty 2

## 2015-05-17 NOTE — ED Notes (Signed)
Pt A&OX4, ambulatory at d/c with steady gait, NAD 

## 2015-05-17 NOTE — ED Provider Notes (Signed)
CSN: [id number]     Arrival date & time 05/17/15  1244 History  This chart was scribed for non-physician practitioner, [facility] M. Damian Leavell, NP working with Pricilla Loveless, MD by Doreatha Martin, ED scribe. This patient was seen in room TR03C/TR03C and the patient's care was started at 2:21 PM    Chief Complaint  Patient presents with  . Abdominal Pain   Patient is a 19 y.o. female presenting with abdominal pain. The history is provided by the patient. No language interpreter was used.  Abdominal Pain Pain location:  RLQ Pain quality: sharp   Pain radiates to:  Does not radiate Pain severity:  Moderate Onset quality:  Gradual Progression:  Improving Relieved by:  Lying down Worsened by:  Movement Ineffective treatments:  None tried Associated symptoms: nausea   Associated symptoms: no chills, no fever, no vaginal bleeding, no vaginal discharge and no vomiting     HPI Comments: Megan Hahn is a 19 y.o. Go with Hx of left ovarian cyst and gas build-up problems who presents to the Emergency Department complaining of sudden onset, moderate, improving sharp RLQ abdominal 5/10 pain onset 1030 this morning. Pt reports associated one episode nausea, dizziness, and light-headedness due to the pain that has since resolved. Pt states that pain is worsened with movement to a 9/10 and relieved by rest back to a 5/10. LNMP was 04/24/15. Never had pap-smear, no contraceptive use. Pt is currently sexually active with last sexual intercourse 4 days ago. She denies Hx of appendectomy. She also denies vaginal bleeding, vaginal discharge, vomiting, fever, and chills.   Past Medical History  Diagnosis Date  . STD (female)    History reviewed. No pertinent past surgical history. No family history on file. History  Substance Use Topics  . Smoking status: Current Every Day Smoker -- 0.25 packs/day    Types: Cigarettes  . Smokeless tobacco: Not on file  . Alcohol Use: Yes   OB History    No data available      Review of Systems  Constitutional: Negative for fever and chills.  Gastrointestinal: Positive for nausea and abdominal pain. Negative for vomiting.  Genitourinary: Negative for vaginal bleeding and vaginal discharge.  Neurological: Positive for dizziness and light-headedness.  All other systems reviewed and are negative.  Allergies  Review of patient's allergies indicates no known allergies.  Home Medications   Prior to Admission medications   Medication Sig Start Date End Date Taking? Authorizing Provider  metroNIDAZOLE (FLAGYL) 500 MG tablet Take 1 tablet (500 mg total) by mouth 2 (two) times daily. 08/22/14   Garlon Hatchet, PA-C   BP 112/56 mmHg  Pulse 70  Temp(Src) 98 F (36.7 C) (Oral)  Resp 16  Ht  (1.6 m)  Wt 179 lb 3.2 oz (81.285 kg)  BMI 31.75 kg/m2  SpO2 100%  LMP 03/30/2015 (Approximate) Physical Exam  Constitutional: She is oriented to person, place, and time. She appears well-developed and well-nourished. No distress.  HENT:  Head: Normocephalic and atraumatic.  Eyes: EOM are normal.  Neck: Normal range of motion. Neck supple. No tracheal deviation present.  Cardiovascular: Normal rate, regular rhythm and normal heart sounds.  Exam reveals no gallop and no friction rub.   No murmur heard. Pulmonary/Chest: Effort normal and breath sounds normal. No respiratory distress.  Abdominal: Soft. Bowel sounds are normal. She exhibits no distension. There is tenderness in the right lower quadrant. There is no rigidity, no rebound and no guarding.  No CVA tenderness.  Genitourinary:  External genitalia without lesions. Mucous d/c vaginal vault. No CMT, right adnexal tenderness, uterus without palpable enlargement.   Musculoskeletal: Normal range of motion. She exhibits no edema or tenderness.  Neurological: She is alert and oriented to person, place, and time.  Skin: Skin is warm and dry.  Psychiatric: She has a normal mood and affect. Her behavior is normal.   Nursing note and vitals reviewed.   ED Course  Procedures (including critical care time) DIAGNOSTIC STUDIES: Oxygen Saturation is 100% on RA, normal by my interpretation.    COORDINATION OF CARE: 2:30 PM Discussed treatment plan with pt at bedside which includes [place] of pelvis and pt agreed to plan.   Labs Review Results for orders placed or performed during the hospital encounter of 05/17/15 (from the past 24 hour(s))  CBC with Differential     Status: None   Collection Time: 05/17/15  2:00 PM  Result Value Ref Range   WBC 7.0 4.0 - 10.5 K/uL   RBC 4.16 3.87 - 5.11 MIL/uL   Hemoglobin 12.8 12.0 - 15.0 g/dL   HCT 54.0 98.1 - 19.1 %   MCV 91.1 78.0 - 100.0 fL   MCH 30.8 26.0 - 34.0 pg   MCHC 33.8 30.0 - 36.0 g/dL   RDW 47.8 29.5 - 62.1 %   Platelets 333 150 - 400 K/uL   Neutrophils Relative % 64 43 - 77 %   Neutro Abs 4.6 1.7 - 7.7 K/uL   Lymphocytes Relative 22 12 - 46 %   Lymphs Abs 1.5 0.7 - 4.0 K/uL   Monocytes Relative 11 3 - 12 %   Monocytes Absolute 0.7 0.1 - 1.0 K/uL   Eosinophils Relative 2 0 - 5 %   Eosinophils Absolute 0.1 0.0 - 0.7 K/uL   Basophils Relative 1 0 - 1 %   Basophils Absolute 0.0 0.0 - 0.1 K/uL  Comprehensive metabolic panel     Status: Abnormal   Collection Time: 05/17/15  2:00 PM  Result Value Ref Range   Sodium 139 135 - 145 mmol/L   Potassium 4.9 3.5 - 5.1 mmol/L   Chloride 105 101 - 111 mmol/L   CO2 26 22 - 32 mmol/L   Glucose, Bld 95 65 - 99 mg/dL   BUN <5 (L) 6 - 20 mg/dL   Creatinine, Ser 3.08 0.44 - 1.00 mg/dL   Calcium 9.0 8.9 - 65.7 mg/dL   Total Protein 6.9 6.5 - 8.1 g/dL   Albumin 4.1 3.5 - 5.0 g/dL   AST 23 15 - 41 U/L   ALT 17 14 - 54 U/L   Alkaline Phosphatase 51 38 - 126 U/L   Total Bilirubin 0.4 0.3 - 1.2 mg/dL   GFR calc non Af Amer >60 >60 mL/min   GFR calc Af Amer >60 >60 mL/min   Anion gap 8 5 - 15  Lipase, blood     Status: Abnormal   Collection Time: 05/17/15  2:00 PM  Result Value Ref Range   Lipase 16 (L) 22 -  51 U/L  Urinalysis, Routine w reflex microscopic (not at [facility])     Status: Abnormal   Collection Time: 05/17/15  2:22 PM  Result Value Ref Range   Color, Urine AMBER (A) YELLOW   APPearance CLOUDY (A) CLEAR   Specific Gravity, Urine 1.037 (H) 1.005 - 1.030   pH 6.5 5.0 - 8.0   Glucose, UA NEGATIVE NEGATIVE mg/dL   Hgb urine dipstick NEGATIVE NEGATIVE   Bilirubin  Urine SMALL (A) NEGATIVE   Ketones, ur 15 (A) NEGATIVE mg/dL   Protein, ur NEGATIVE NEGATIVE mg/dL   Urobilinogen, UA 1.0 0.0 - 1.0 mg/dL   Nitrite NEGATIVE NEGATIVE   Leukocytes, UA SMALL (A) NEGATIVE  Urine microscopic-add on     Status: Abnormal   Collection Time: 05/17/15  2:22 PM  Result Value Ref Range   Squamous Epithelial / LPF MANY (A) RARE   WBC, UA 3-6 <3 WBC/hpf   RBC / HPF 0-2 <3 RBC/hpf   Bacteria, UA FEW (A) RARE   Urine-Other MUCOUS PRESENT   POC Urine Pregnancy, ED ([facility])     Status: None   Collection Time: 05/17/15  2:26 PM  Result Value Ref Range   Preg Test, Ur NEGATIVE NEGATIVE  Wet prep, genital     Status: Abnormal   Collection Time: 05/17/15  2:44 PM  Result Value Ref Range   Yeast Wet Prep HPF POC NONE SEEN NONE SEEN   Trich, Wet Prep NONE SEEN NONE SEEN   Clue Cells Wet Prep HPF POC FEW (A) NONE SEEN   WBC, Wet Prep HPF POC MANY (A) NONE SEEN     Imaging Review [place] Transvaginal Non-ob  05/17/2015   CLINICAL DATA:  Right lower quadrant pain.  Onset this morning.  EXAM: TRANSABDOMINAL AND TRANSVAGINAL ULTRASOUND OF PELVIS  TECHNIQUE: Both transabdominal and transvaginal ultrasound examinations of the pelvis were performed. Transabdominal technique was performed for global imaging of the pelvis including uterus, ovaries, adnexal regions, and pelvic cul-de-sac. It was necessary to proceed with endovaginal exam following the transabdominal exam to visualize the uterus, endometrium and ovaries.  COMPARISON:  CT 01/23/2014  FINDINGS: Uterus  Measurements: 8.5 x 4.3 x 5.1 cm. No fibroids  or other mass visualized. Normal anteverted position of the uterus.  Endometrium  Thickness: 0.9 cm.  No focal abnormality visualized.  Right ovary  Measurements: 4.3 x 1.8 x 2.8 cm. Normal appearance/no adnexal mass.  Left ovary  Measurements: 3.9 x 1.8 x 2.0 cm. Normal appearance/no adnexal mass.  Other findings  Small amount of free fluid.  This free fluid could be physiologic.  IMPRESSION: Normal pelvic ultrasound.   Electronically Signed   By: Richarda OverlieAdam  Henn M.D.   On: 05/17/2015 17:09   [place] Pelvis Complete  05/17/2015   CLINICAL DATA:  Right lower quadrant pain.  Onset this morning.  EXAM: TRANSABDOMINAL AND TRANSVAGINAL ULTRASOUND OF PELVIS  TECHNIQUE: Both transabdominal and transvaginal ultrasound examinations of the pelvis were performed. Transabdominal technique was performed for global imaging of the pelvis including uterus, ovaries, adnexal regions, and pelvic cul-de-sac. It was necessary to proceed with endovaginal exam following the transabdominal exam to visualize the uterus, endometrium and ovaries.  COMPARISON:  CT 01/23/2014  FINDINGS: Uterus  Measurements: 8.5 x 4.3 x 5.1 cm. No fibroids or other mass visualized. Normal anteverted position of the uterus.  Endometrium  Thickness: 0.9 cm.  No focal abnormality visualized.  Right ovary  Measurements: 4.3 x 1.8 x 2.8 cm. Normal appearance/no adnexal mass.  Left ovary  Measurements: 3.9 x 1.8 x 2.0 cm. Normal appearance/no adnexal mass.  Other findings  Small amount of free fluid.  This free fluid could be physiologic.  IMPRESSION: Normal pelvic ultrasound.   Electronically Signed   By: Richarda OverlieAdam  Henn M.D.   On: 05/17/2015 17:09   Toradol 30 mg. IM  MDM  19 y.o. female with pelvic pain that has resolved since arrival to the ED. Stable for  d/c without pain. No concern for acute abdomen. Discussed with the patient and all questioned fully answered. She will follow up at [facility].   Final diagnoses:  Pelvic pain in female   I  personally performed the services described in this documentation, which was scribed in my presence. The recorded information has been reviewed and is accurate.    [address] [place], [place] 05/18/15 0454  Pricilla Loveless, MD 05/21/15 [phone]

## 2015-05-17 NOTE — ED Notes (Signed)
This RN called US to determine delay in transporting to US and reports she is next to go.

## 2015-05-17 NOTE — ED Notes (Signed)
Pt presents to department for evaluation of R sided abdominal pain. Onset this afternoon while at work. 7/10 pain upon arrival to ED.

## 2015-05-17 NOTE — Discharge Instructions (Signed)
Abdominal Pain, Women °Abdominal (stomach, pelvic, or belly) pain can be caused by many things. It is important to tell your doctor: °· The location of the pain. °· Does it come and go or is it present all the time? °· Are there things that start the pain (eating certain foods, exercise)? °· Are there other symptoms associated with the pain (fever, nausea, vomiting, diarrhea)? °All of this is helpful to know when trying to find the cause of the pain. °CAUSES  °· Stomach: virus or bacteria infection, or ulcer. °· Intestine: appendicitis (inflamed appendix), regional ileitis (Crohn's disease), ulcerative colitis (inflamed colon), irritable bowel syndrome, diverticulitis (inflamed diverticulum of the colon), or cancer of the stomach or intestine. °· Gallbladder disease or stones in the gallbladder. °· Kidney disease, kidney stones, or infection. °· Pancreas infection or cancer. °· Fibromyalgia (pain disorder). °· Diseases of the female organs: °¨ Uterus: fibroid (non-cancerous) tumors or infection. °¨ Fallopian tubes: infection or tubal pregnancy. °¨ Ovary: cysts or tumors. °¨ Pelvic adhesions (scar tissue). °¨ Endometriosis (uterus lining tissue growing in the pelvis and on the pelvic organs). °¨ Pelvic congestion syndrome (female organs filling up with blood just before the menstrual period). °¨ Pain with the menstrual period. °¨ Pain with ovulation (producing an egg). °¨ Pain with an IUD (intrauterine device, birth control) in the uterus. °¨ Cancer of the female organs. °· Functional pain (pain not caused by a disease, may improve without treatment). °· Psychological pain. °· Depression. °DIAGNOSIS  °Your doctor will decide the seriousness of your pain by doing an examination. °· Blood tests. °· X-rays. °· Ultrasound. °· CT scan (computed tomography, special type of X-ray). °· MRI (magnetic resonance imaging). °· Cultures, for infection. °· Barium enema (dye inserted in the large intestine, to better view it with  X-rays). °· Colonoscopy (looking in intestine with a lighted tube). °· Laparoscopy (minor surgery, looking in abdomen with a lighted tube). °· Major abdominal exploratory surgery (looking in abdomen with a large incision). °TREATMENT  °The treatment will depend on the cause of the pain.  °· Many cases can be observed and treated at home. °· Over-the-counter medicines recommended by your caregiver. °· Prescription medicine. °· Antibiotics, for infection. °· Birth control pills, for painful periods or for ovulation pain. °· Hormone treatment, for endometriosis. °· Nerve blocking injections. °· Physical therapy. °· Antidepressants. °· Counseling with a psychologist or psychiatrist. °· Minor or major surgery. °HOME CARE INSTRUCTIONS  °· Do not take laxatives, unless directed by your caregiver. °· Take over-the-counter pain medicine only if ordered by your caregiver. Do not take aspirin because it can cause an upset stomach or bleeding. °· Try a clear liquid diet (broth or water) as ordered by your caregiver. Slowly move to a bland diet, as tolerated, if the pain is related to the stomach or intestine. °· Have a thermometer and take your temperature several times a day, and record it. °· Bed rest and sleep, if it helps the pain. °· Avoid sexual intercourse, if it causes pain. °· Avoid stressful situations. °· Keep your follow-up appointments and tests, as your caregiver orders. °· If the pain does not go away with medicine or surgery, you may try: °¨ Acupuncture. °¨ Relaxation exercises (yoga, meditation). °¨ Group therapy. °¨ Counseling. °SEEK MEDICAL CARE IF:  °· You notice certain foods cause stomach pain. °· Your home care treatment is not helping your pain. °· You need stronger pain medicine. °· You want your IUD removed. °· You feel faint or   lightheaded. °· You develop nausea and vomiting. °· You develop a rash. °· You are having side effects or an allergy to your medicine. °SEEK IMMEDIATE MEDICAL CARE IF:  °· Your  pain does not go away or gets worse. °· You have a fever. °· Your pain is felt only in portions of the abdomen. The right side could possibly be appendicitis. The left lower portion of the abdomen could be colitis or diverticulitis. °· You are passing blood in your stools (bright red or black tarry stools, with or without vomiting). °· You have blood in your urine. °· You develop chills, with or without a fever. °· You pass out. °MAKE SURE YOU:  °· Understand these instructions. °· Will watch your condition. °· Will get help right away if you are not doing well or get worse. °Document Released: 09/23/2007 Document Revised: 04/12/2014 Document Reviewed: 10/13/2009 °[employer]® Patient Information ©2015 [employer]. This information is not intended to replace advice given to you by your health care provider. Make sure you discuss any questions you have with your health care provider. ° °

## 2015-05-18 LAB — GC/CHLAMYDIA PROBE AMP (~~LOC~~) NOT AT ARMC
Chlamydia: POSITIVE — AB
NEISSERIA GONORRHEA: NEGATIVE

## 2015-05-19 ENCOUNTER — Telehealth (HOSPITAL_COMMUNITY): Payer: Self-pay

## 2015-05-19 NOTE — ED Notes (Signed)
Positive for chlamydia. Chart sent to edp office for review 

## 2015-05-20 ENCOUNTER — Telehealth: Payer: Self-pay | Admitting: *Deleted

## 2015-05-20 ENCOUNTER — Telehealth: Payer: Self-pay | Admitting: Emergency Medicine

## 2015-05-20 NOTE — Telephone Encounter (Signed)
Post ED Visit - Positive Culture Follow-up: Successful Patient Follow-Up   Positive Chlamydia culture  [x]  Patient discharged without antimicrobial prescription and treatment is now indicated []  Organism is resistant to prescribed ED discharge antimicrobial []  Patient with positive blood cultures  Changes discussed with ED provider: Jerelyn Scott, MD New antibiotic prescription: Azithromycin one gram PO x once Called to [facility] [phone]  Contacted patient, date 05/20/15, time 1752  ID verified, patient notified of positive Chlamydia and need for treatment. STD instructions provided, patient verbalized understanding. RX Azithromycin called to [facility] [phone]  Jiles Harold 05/20/2015, 5:54 PM

## 2015-05-23 IMAGING — CT CT ABD-PELV W/ CM
2 of 7 series · 15 of 46 positions shown, 17 images · IV contrast (APPLIED)
Comparison: US ABDOMEN COMPLETE dated 10/06/2009

CLINICAL DATA: Left lower quadrant abdominal pain, vomiting and
diarrhea

EXAM:
CT ABDOMEN AND PELVIS WITH CONTRAST
TECHNIQUE: Multidetector CT imaging of the abdomen and pelvis was performed
using the standard protocol following bolus administration of
intravenous contrast.
CONTRAST:  100mL OMNIPAQUE IOHEXOL 300 MG/ML  SOLN

[Series 6: coronals · coronal · 0.54mm/px · 3 of 101 slices shown]
[im 21/101  soft-tissue]
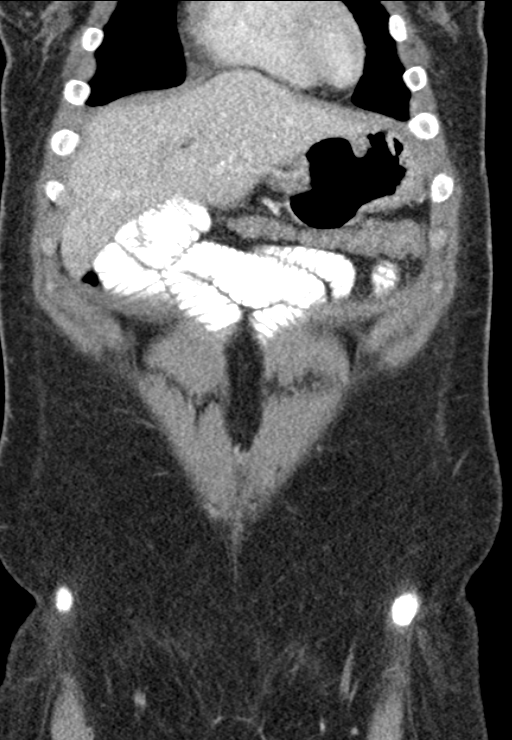
[im 41/101  soft-tissue]
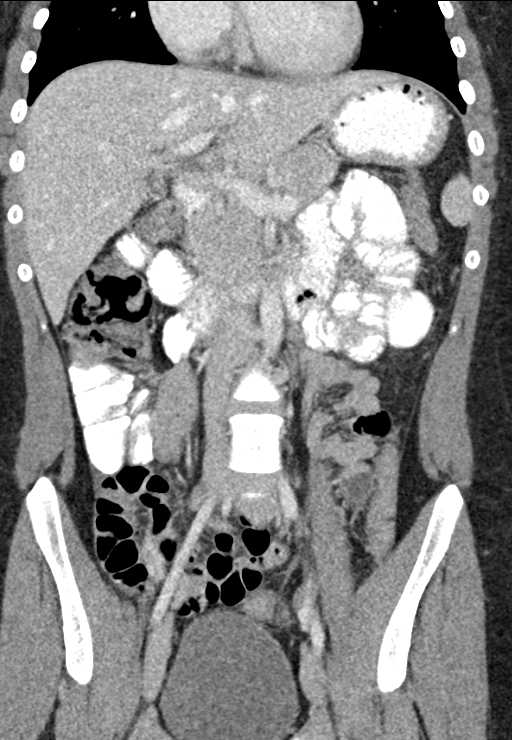
[im 61/101  soft-tissue]
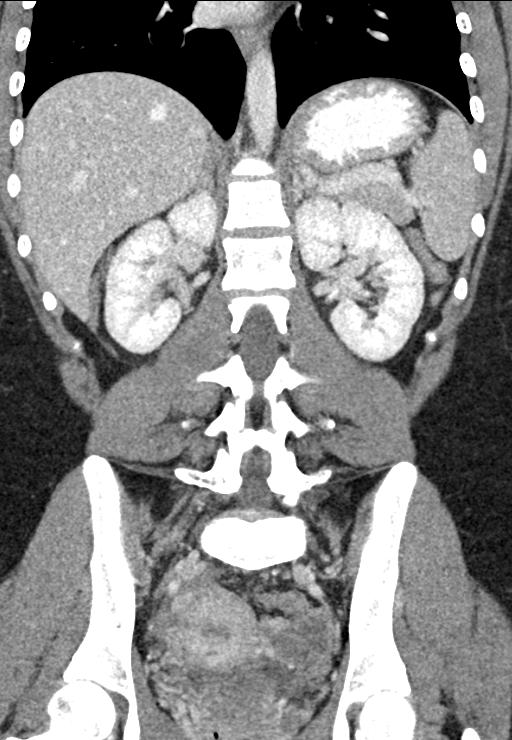

[Series 12: abd/ pelvis 5.0 i30f 1 · axial · 0.58mm/px · z∈[-322,+32]mm · 12 of 79 slices shown, 14 images]
[im 4/79  soft-tissue]
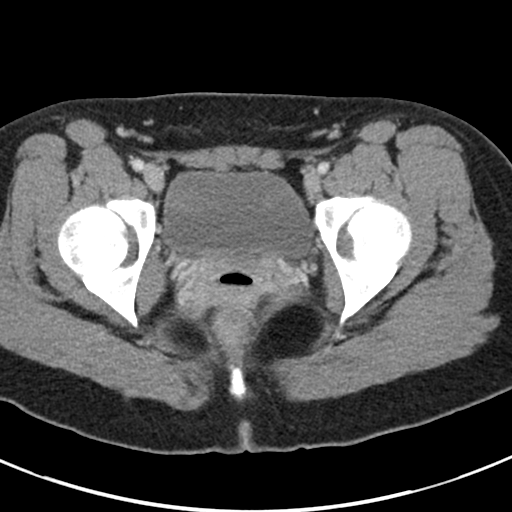
[im 4/79  bone]
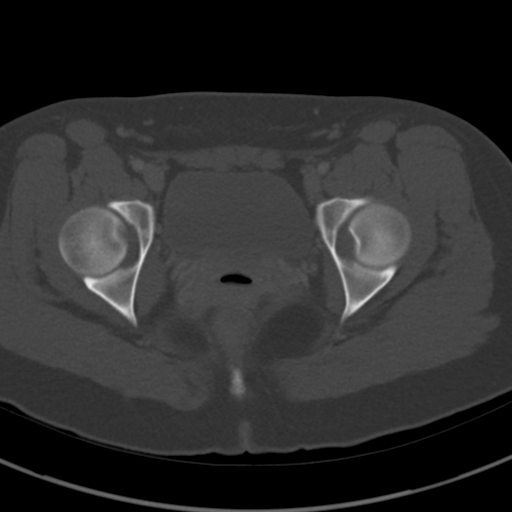
[im 12/79  soft-tissue]
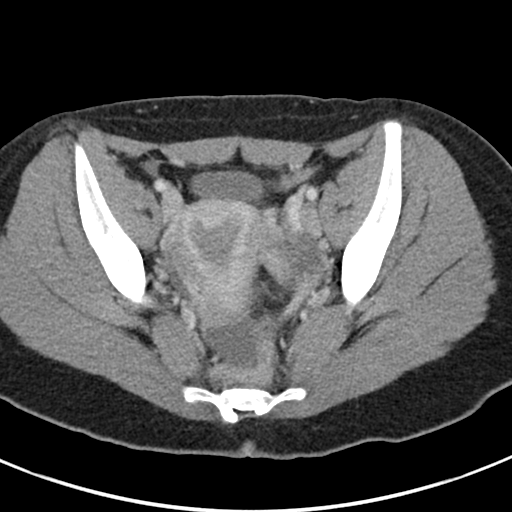
[im 19/79  soft-tissue]
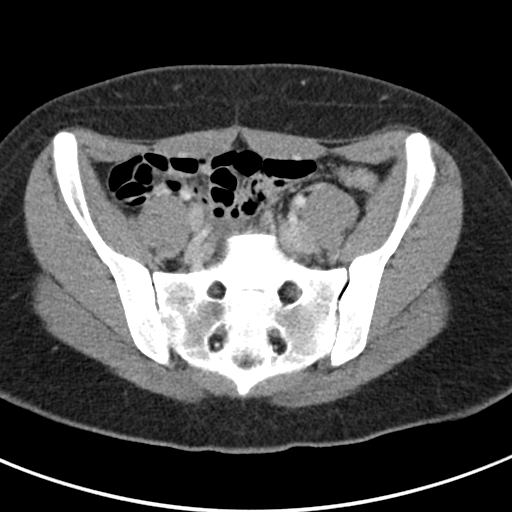
[im 23/79  soft-tissue]
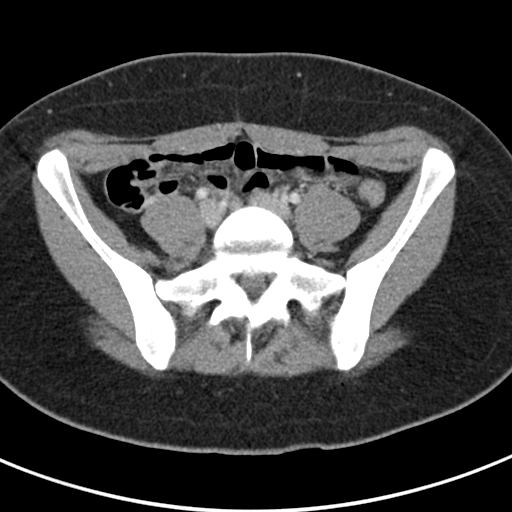
[im 30/79  soft-tissue]
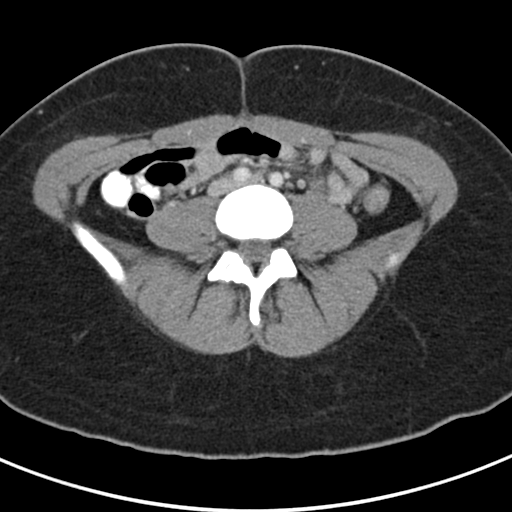
[im 38/79  soft-tissue]
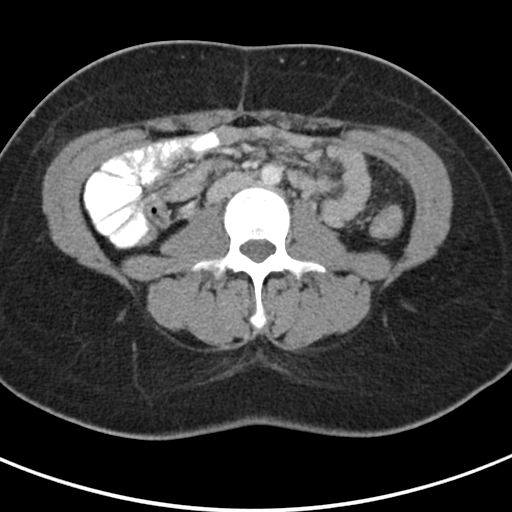
[im 41/79  soft-tissue]
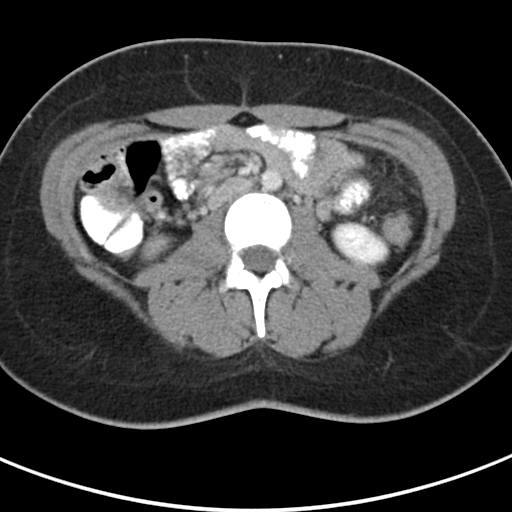
[im 49/79  soft-tissue]
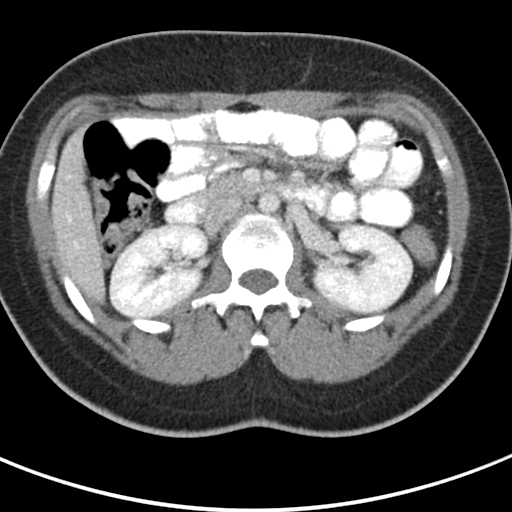
[im 56/79  soft-tissue]
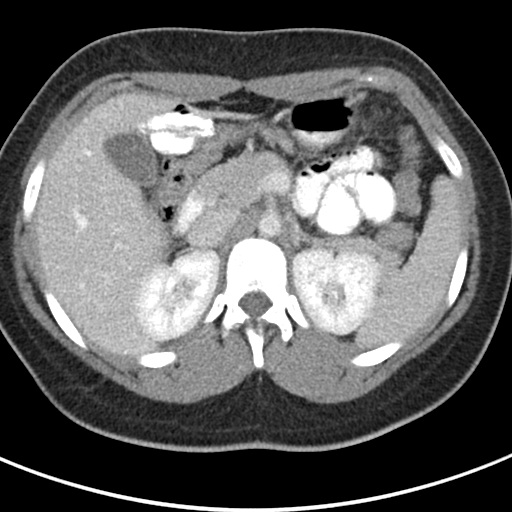
[im 56/79  bone]
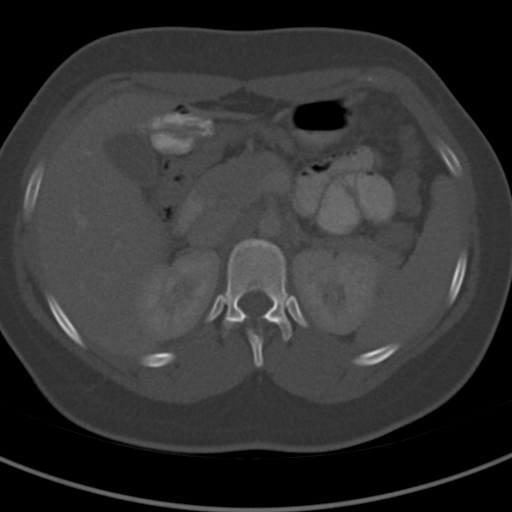
[im 60/79  soft-tissue]
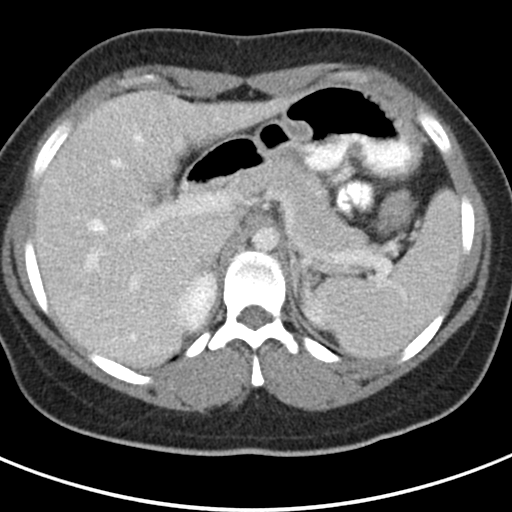
[im 67/79  soft-tissue]
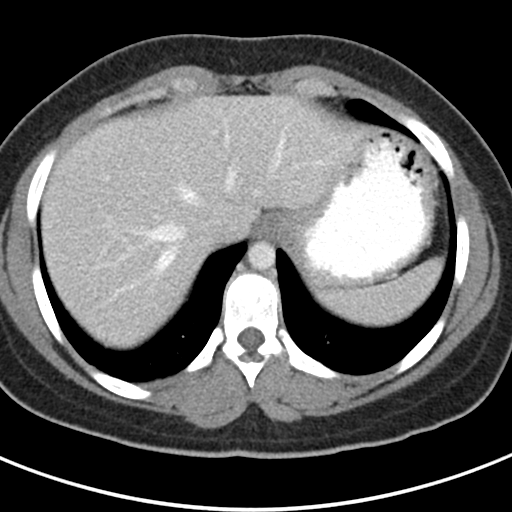
[im 75/79  soft-tissue]
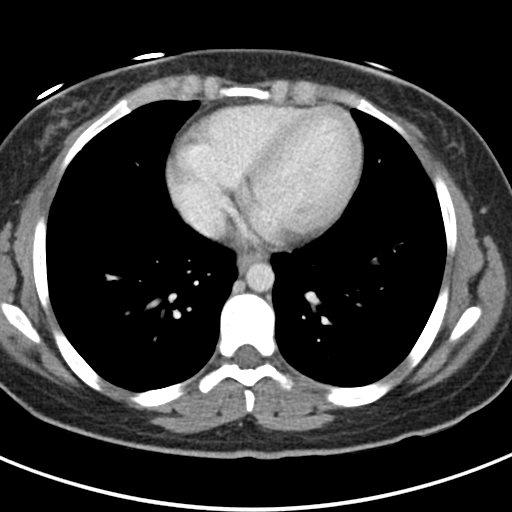

[15 of 46 positions shown; findings below may reference images not displayed]

FINDINGS: Normal hepatic contour. No discrete hepatic lesions. Normal
appearance of the gallbladder. No radiopaque gallstones. No intra or
extrahepatic biliary duct dilatation. No ascites.

There is symmetric enhancement and excretion of bilateral kidneys.
No definite renal stones on this postcontrast examination. No
discrete renal lesions. No urinary destruction or perinephric
stranding. Normal appearance of the bilateral adrenal glands,
pancreas and spleen.

Ingestion enteric contrast extends to the level of the distal small
bowel. No evidence of enteric obstruction. Normal appearance of the
retrocecal appendix which is noted to course cranially about the
caudal aspect of the right lobe of the liver. No pneumoperitoneum,
pneumatosis or portal venous gas.

Normal caliber the abdominal aorta. The major branch vessels of the
abdominal aorta appear patent on this non CT examination. Incidental
note is made of an a circum aortic left-sided renal vein. No
retroperitoneal, mesenteric, pelvic or inguinal lymphadenopathy.

Note is made of an approximately 1.9 x 1.2 cm hypo attenuating (11
Hounsfield unit) presumed physiologic left-sided adnexal cyst
(coronal image 60, series 6). There is a minimal amount of fluid
(measuring approximately 10 Hounsfield units within the adjacent
left side adnexa and pelvic cul-de-sac (axial image 70, series 12).
There is a minimal amount of presumably physiologic fluid within the
endometrial canal. No discrete right-sided adnexal lesions.

Limited visualization of the lower thorax demonstrates minimal
bibasilar dependent ground-glass atelectasis, right greater than
left. There is minimal subsegmental atelectasis from the caudal
aspect of the right middle lobe. No discrete focal airspace
opacities. No pleural effusion.

Normal heart size.  No pericardial effusion.

No acute or aggressive osseus abnormalities. Regional soft tissues
appear normal.
IMPRESSION: 1. Approximately 1.9 cm left-sided adnexal cyst with minimal amount
of presumed physiologic fluid within the adjacent left adnexa,
pelvic cul-de-sac and endometrial canal.
2. Otherwise, no explanation for patient's left lower abdominal
quadrant abdominal pain. Specifically, no evidence of enteric or
urinary obstruction. Normal appearance of the appendix.

## 2016-01-19 ENCOUNTER — Emergency Department (HOSPITAL_BASED_OUTPATIENT_CLINIC_OR_DEPARTMENT_OTHER)
Admission: EM | Admit: 2016-01-19 | Discharge: 2016-01-19 | Disposition: A | Discharge status: Home / Self Care | Payer: Medicaid Other | Attending: Emergency Medicine | Admitting: Emergency Medicine

## 2016-01-19 ENCOUNTER — Encounter (HOSPITAL_BASED_OUTPATIENT_CLINIC_OR_DEPARTMENT_OTHER): Payer: Self-pay | Admitting: *Deleted

## 2016-01-19 DIAGNOSIS — R1030 Lower abdominal pain, unspecified: Secondary | ICD-10-CM

## 2016-01-19 DIAGNOSIS — Z8619 Personal history of other infectious and parasitic diseases: Secondary | ICD-10-CM | POA: Insufficient documentation

## 2016-01-19 DIAGNOSIS — N760 Acute vaginitis: Secondary | ICD-10-CM | POA: Insufficient documentation

## 2016-01-19 DIAGNOSIS — Z3202 Encounter for pregnancy test, result negative: Secondary | ICD-10-CM | POA: Insufficient documentation

## 2016-01-19 DIAGNOSIS — F17210 Nicotine dependence, cigarettes, uncomplicated: Secondary | ICD-10-CM | POA: Insufficient documentation

## 2016-01-19 DIAGNOSIS — B9689 Other specified bacterial agents as the cause of diseases classified elsewhere: Secondary | ICD-10-CM

## 2016-01-19 DIAGNOSIS — Z79899 Other long term (current) drug therapy: Secondary | ICD-10-CM | POA: Insufficient documentation

## 2016-01-19 LAB — URINE MICROSCOPIC-ADD ON

## 2016-01-19 LAB — BASIC METABOLIC PANEL
Anion gap: 9 (ref 5–15)
BUN: 11 mg/dL (ref 6–20)
CHLORIDE: 107 mmol/L (ref 101–111)
CO2: 21 mmol/L — ABNORMAL LOW (ref 22–32)
CREATININE: 0.72 mg/dL (ref 0.44–1.00)
Calcium: 8.8 mg/dL — ABNORMAL LOW (ref 8.9–10.3)
GFR calc Af Amer: >60 mL/min (ref >60)
GFR calc non Af Amer: >60 mL/min (ref >60)
Glucose, Bld: 96 mg/dL (ref 65–99)
Potassium: 3.6 mmol/L (ref 3.5–5.1)
Sodium: 137 mmol/L (ref 135–145)

## 2016-01-19 LAB — CBC WITH DIFFERENTIAL/PLATELET
Basophils Absolute: 0 10*3/uL (ref 0.0–0.1)
Basophils Relative: 0 %
Eosinophils Absolute: 0.1 10*3/uL (ref 0.0–0.7)
Eosinophils Relative: 2 %
HEMATOCRIT: 38.9 % (ref 36.0–46.0)
HEMOGLOBIN: 13 g/dL (ref 12.0–15.0)
LYMPHS ABS: 1.5 10*3/uL (ref 0.7–4.0)
Lymphocytes Relative: 22 %
MCH: 30.4 pg (ref 26.0–34.0)
MCHC: 33.4 g/dL (ref 30.0–36.0)
MCV: 90.9 fL (ref 78.0–100.0)
MONOS PCT: 9 %
Monocytes Absolute: 0.6 10*3/uL (ref 0.1–1.0)
NEUTROS ABS: 4.6 10*3/uL (ref 1.7–7.7)
NEUTROS PCT: 67 %
Platelets: 363 10*3/uL (ref 150–400)
RBC: 4.28 MIL/uL (ref 3.87–5.11)
RDW: 12.9 % (ref 11.5–15.5)
WBC: 6.8 10*3/uL (ref 4.0–10.5)

## 2016-01-19 LAB — URINALYSIS, ROUTINE W REFLEX MICROSCOPIC
Bilirubin Urine: NEGATIVE
GLUCOSE, UA: NEGATIVE mg/dL
Ketones, ur: NEGATIVE mg/dL
Leukocytes, UA: NEGATIVE
Nitrite: NEGATIVE
PROTEIN: NEGATIVE mg/dL
Specific Gravity, Urine: 1.031 — ABNORMAL HIGH (ref 1.005–1.030)
pH: 6 (ref 5.0–8.0)

## 2016-01-19 LAB — RAPID HIV SCREEN (HIV 1/2 AB+AG)
HIV 1/2 Antibodies: NONREACTIVE
HIV-1 P24 Antigen - HIV24: NONREACTIVE

## 2016-01-19 LAB — WET PREP, GENITAL
SPERM: NONE SEEN
Trich, Wet Prep: NONE SEEN
Yeast Wet Prep HPF POC: NONE SEEN

## 2016-01-19 LAB — PREGNANCY, URINE: PREG TEST UR: NEGATIVE

## 2016-01-19 MED ORDER — CEFTRIAXONE SODIUM 250 MG IJ SOLR
250.0000 mg | Freq: Once | INTRAMUSCULAR | Status: AC
  Administered 2016-01-19: 250 mg via INTRAMUSCULAR
  Filled 2016-01-19: qty 250

## 2016-01-19 MED ORDER — METRONIDAZOLE 500 MG PO TABS: 500.0000 mg | ORAL_TABLET | Freq: Two times a day (BID) | ORAL | Status: DC

## 2016-01-19 MED ORDER — AZITHROMYCIN 250 MG PO TABS
1000.0000 mg | ORAL_TABLET | Freq: Once | ORAL | Status: AC
  Administered 2016-01-19: 1000 mg via ORAL
  Filled 2016-01-19: qty 4

## 2016-01-19 MED ORDER — LIDOCAINE HCL (PF) 1 % IJ SOLN
INTRAMUSCULAR | Status: AC
  Administered 2016-01-19: 2.1 mL
  Filled 2016-01-19: qty 5

## 2016-01-19 MED FILL — metroNIDAZOLE 500 MG TABS: 500 | 7 days supply | Qty: 14 | Fill #0

## 2016-01-19 NOTE — ED Notes (Signed)
Lower abdominal pain x 2 weeks. Vaginal itching and odor.

## 2016-01-19 NOTE — ED Provider Notes (Signed)
CSN: [id number]     Arrival date & time 01/19/16  1102 History   First MD Initiated Contact with Patient 01/19/16 1128     Chief Complaint  Patient presents with  . Abdominal Pain     (Consider location/radiation/quality/duration/timing/severity/associated sxs/prior Treatment) HPI 20 year old female who presents with abdominal pain. History of ovarian cysts and prior STDs. States she had unprotected sexual intercourse with potential STD exposure one month ago. Reports 2 weeks of low abdominal pain, localized to suprapubic abdomen, intermittent in nature.  Having increased vaginal itchiness. Reports vaginal discharge but without discoloration or abnormal odor. No dysuria, urinary frequency, nausea, vomiting, fever, chills, diarrhea.   Past Medical History  Diagnosis Date  . STD (female)    History reviewed. No pertinent past surgical history. History reviewed. No pertinent family history. Social History  Substance Use Topics  . Smoking status: Current Every Day Smoker -- 0.25 packs/day    Types: Cigarettes  . Smokeless tobacco: None  . Alcohol Use: Yes   OB History    No data available     Review of Systems 10/14 systems reviewed and are negative other than those stated in the HPI    Allergies  Review of patient's allergies indicates no known allergies.  Home Medications   Prior to Admission medications   Medication Sig Start Date End Date Taking? Authorizing Provider  metroNIDAZOLE (FLAGYL) 500 MG tablet Take 1 tablet (500 mg total) by mouth 2 (two) times daily. 08/22/14   Garlon Hatchet, PA-C  metroNIDAZOLE (FLAGYL) 500 MG tablet Take 1 tablet (500 mg total) by mouth 2 (two) times daily. 01/19/16   Lavera Guise, MD   BP 130/63 mmHg  Pulse 68  Temp(Src) 98.4 F (36.9 C) (Oral)  Resp 20  Ht  (1.6 m)  Wt 179 lb (81.194 kg)  BMI 31.72 kg/m2  SpO2 100%  LMP 01/05/2016 Physical Exam Physical Exam  Nursing note and vitals reviewed. Constitutional: Well developed,  well nourished, non-toxic, and in no acute distress Head: Normocephalic and atraumatic.  Mouth/Throat: Oropharynx is clear and moist.  Neck: Normal range of motion. Neck supple.  Cardiovascular: Normal rate and regular rhythm.   Pulmonary/Chest: Effort normal and breath sounds normal.  Abdominal: Soft. There is mild suprapubic tenderness. There is no rebound and no guarding. No CVA tenderness. Pelvic: Normal external genitalia. Normal internal genitalia. Copious white vaginal discharge. No blood within the vagina. No cervical motion tenderness. No adnexal masses or tenderness. Musculoskeletal: Normal range of motion.  Neurological: Alert, no facial droop, fluent speech, moves all extremities symmetrically Skin: Skin is warm and dry.  Psychiatric: Cooperative  ED Course  Procedures (including critical care time) Labs Review Labs Reviewed  WET PREP, GENITAL - Abnormal; Notable for the following:    Clue Cells Wet Prep HPF POC PRESENT (*)    WBC, Wet Prep HPF POC MODERATE (*)    All other components within normal limits  URINALYSIS, ROUTINE W REFLEX MICROSCOPIC (NOT AT [facility]) - Abnormal; Notable for the following:    APPearance CLOUDY (*)    Specific Gravity, Urine 1.031 (*)    Hgb urine dipstick SMALL (*)    All other components within normal limits  URINE MICROSCOPIC-ADD ON - Abnormal; Notable for the following:    Squamous Epithelial / LPF 6-30 (*)    Bacteria, UA FEW (*)    All other components within normal limits  BASIC METABOLIC PANEL - Abnormal; Notable for the following:    CO2  21 (*)    Calcium 8.8 (*)    All other components within normal limits  PREGNANCY, URINE  CBC WITH DIFFERENTIAL/PLATELET  RPR  RAPID HIV SCREEN (HIV 1/2 AB+AG)  GC/CHLAMYDIA PROBE AMP ([place]) NOT AT [facility]    Imaging Review No results found. I have personally reviewed and evaluated these images and lab results as part of my medical decision-making.   EKG Interpretation None      MDM    Final diagnoses:  Bacterial vaginosis  Lower abdominal pain    20 year old female, otherwise healthy without prior abdominal surgeries, who presents with suprapubic abdominal pain. He is well-appearing in no acute distress on presentation. Vital signs are within normal limits. She is a soft and benign abdomen, with focal suprapubic tenderness. Pelvic exam without cervical motion tenderness or adnexal tenderness to suggest PID or ovarian pathology, respectively. Basic blood work unremarkable. Wet prep revealing evidence of bacterial vaginosis for which she is given a course of Flagyl. She has requested empiric treatment for STDs, and received IM ceftriaxone and a gram of azithromycin. I have reviewed strict return and follow-up instructions. She expressed understanding of all discharge instructions and felt comfortable with the plan of care.    Lavera Guise, MD 01/19/16 1249

## 2016-01-19 NOTE — Discharge Instructions (Signed)
Take antibiotics as prescribed. You are also treated for potential chlamydia and gonorrhea, but the cultures will not come back until 2-3 days. Return without fail for worsening symptoms, including fever, worsening pain, vomiting unable to keep food or fluids, or any other symptoms concerning to you.  Bacterial Vaginosis Bacterial vaginosis is an infection of the vagina. It happens when too many germs (bacteria) grow in the vagina. Having this infection puts you at risk for getting other infections from sex. Treating this infection can help lower your risk for other infections, such as:   Chlamydia.  Gonorrhea.  HIV.  Herpes. HOME CARE  Take your medicine as told by your doctor.  Finish your medicine even if you start to feel better.  Tell your sex partner that you have an infection. They should see their doctor for treatment.  During treatment:  Avoid sex or use condoms correctly.  Do not douche.  Do not drink alcohol unless your doctor tells you it is ok.  Do not breastfeed unless your doctor tells you it is ok. GET HELP IF:  You are not getting better after 3 days of treatment.  You have more grey fluid (discharge) coming from your vagina than before.  You have more pain than before.  You have a fever. MAKE SURE YOU:   Understand these instructions.  Will watch your condition.  Will get help right away if you are not doing well or get worse.   This information is not intended to replace advice given to you by your health care provider. Make sure you discuss any questions you have with your health care provider.   Document Released: 09/04/2008 Document Revised: 12/17/2014 Document Reviewed: 07/08/2013 Elsevier Interactive Patient Education 2016 [employer]  Abdominal Pain, Adult Many things can cause belly (abdominal) pain. Most times, the belly pain is not dangerous. Many cases of belly pain can be watched and treated at home. HOME CARE   Do not take  medicines that help you go poop (laxatives) unless told to by your doctor.  Only take medicine as told by your doctor.  Eat or drink as told by your doctor. Your doctor will tell you if you should be on a special diet. GET HELP IF:  You do not know what is causing your belly pain.  You have belly pain while you are sick to your stomach (nauseous) or have runny poop (diarrhea).  You have pain while you pee or poop.  Your belly pain wakes you up at night.  You have belly pain that gets worse or better when you eat.  You have belly pain that gets worse when you eat fatty foods.  You have a fever. GET HELP RIGHT AWAY IF:   The pain does not go away within 2 hours.  You keep throwing up (vomiting).  The pain changes and is only in the right or left part of the belly.  You have bloody or tarry looking poop. MAKE SURE YOU:   Understand these instructions.  Will watch your condition.  Will get help right away if you are not doing well or get worse.   This information is not intended to replace advice given to you by your health care provider. Make sure you discuss any questions you have with your health care provider.   Document Released: 05/14/2008 Document Revised: 12/17/2014 Document Reviewed: 08/05/2013 Elsevier Interactive Patient Education [employer]

## 2016-01-20 LAB — GC/CHLAMYDIA PROBE AMP (~~LOC~~) NOT AT ARMC
CHLAMYDIA, DNA PROBE: NEGATIVE
NEISSERIA GONORRHEA: NEGATIVE

## 2016-01-20 LAB — RPR: RPR: NONREACTIVE

## 2016-02-25 ENCOUNTER — Encounter (HOSPITAL_BASED_OUTPATIENT_CLINIC_OR_DEPARTMENT_OTHER): Payer: Self-pay | Admitting: *Deleted

## 2016-02-25 ENCOUNTER — Emergency Department (HOSPITAL_BASED_OUTPATIENT_CLINIC_OR_DEPARTMENT_OTHER)
Admission: EM | Admit: 2016-02-25 | Discharge: 2016-02-25 | Disposition: A | Discharge status: Home / Self Care | Payer: Medicaid Other | Attending: Emergency Medicine | Admitting: Emergency Medicine

## 2016-02-25 DIAGNOSIS — B349 Viral infection, unspecified: Secondary | ICD-10-CM

## 2016-02-25 DIAGNOSIS — J40 Bronchitis, not specified as acute or chronic: Secondary | ICD-10-CM

## 2016-02-25 DIAGNOSIS — F17210 Nicotine dependence, cigarettes, uncomplicated: Secondary | ICD-10-CM | POA: Insufficient documentation

## 2016-02-25 DIAGNOSIS — Z8619 Personal history of other infectious and parasitic diseases: Secondary | ICD-10-CM | POA: Insufficient documentation

## 2016-02-25 MED ORDER — ALBUTEROL SULFATE HFA 108 (90 BASE) MCG/ACT IN AERS
2.0000 | INHALATION_SPRAY | Freq: Once | RESPIRATORY_TRACT | Status: AC
  Administered 2016-02-25: 2 via RESPIRATORY_TRACT
  Filled 2016-02-25: qty 6.7

## 2016-02-25 NOTE — Discharge Instructions (Signed)
Return to the ED with any concerns including difficulty breathing despite using albuterol every 4 hours, not drinking fluids, decreased urine output, vomiting and not able to keep down liquids or medications, decreased level of alertness/lethargy, or any other alarming symptoms °

## 2016-02-25 NOTE — ED Notes (Signed)
Pt reports dry cough since yesterday.

## 2016-02-25 NOTE — ED Provider Notes (Signed)
CSN: [id number]     Arrival date & time 02/25/16  [id number] History   First MD Initiated Contact with Patient 02/25/16 0715     Chief Complaint  Patient presents with  . Cough     (Consider location/radiation/quality/duration/timing/severity/associated sxs/prior Treatment) HPI  Pt presenting with cough and congestion with sore throat.  Symptoms started yesterday.  No fever or chills.  No vomiting or diarrhea.  No difficulty breathing.  She states she has pain with cough as it is deep and harsh.  No difficulty swallowing.  No sick contacts.  She was not able to go to work today which prompted ED visit.  She states she is drinking plenty of liquids.  She has not had any treatment prior to arrival.  There are no other associated systemic symptoms, there are no other alleviating or modifying factors.   Past Medical History  Diagnosis Date  . STD (female)    History reviewed. No pertinent past surgical history. History reviewed. No pertinent family history. Social History  Substance Use Topics  . Smoking status: Current Every Day Smoker -- 0.25 packs/day    Types: Cigarettes  . Smokeless tobacco: None  . Alcohol Use: Yes   OB History    No data available     Review of Systems  ROS reviewed and all otherwise negative except for mentioned in HPI    Allergies  Review of patient's allergies indicates no known allergies.  Home Medications   Prior to Admission medications   Not on File   BP 131/74 mmHg  Pulse 102  Temp(Src) 98.8 F (37.1 C) (Oral)  Resp 18  Ht 5\' 3"  (1.6 m)  Wt 179 lb (81.194 kg)  BMI 31.72 kg/m2  SpO2 98%  LMP 02/25/2016  Vitals reviewed Physical Exam  Physical Examination: General appearance - alert, well appearing, and in no distress Mental status - alert, oriented to person, place, and time Eyes - no conjunctival injection no scleral icterus Mouth - mucous membranes moist, pharynx normal without lesions, mild erythema of OP, no exudate, palate symmetric,  uvula midline Chest - clear to auscultation, no wheezes, rales or rhonchi, symmetric air entry, normal respiratory effort Heart - normal rate, regular rhythm, normal S1, S2, no murmurs, rubs, clicks or gallops Abdomen - soft, nontender, nondistended, no masses or organomegaly Neurological - alert, oriented, normal speech Extremities - peripheral pulses normal, no pedal edema, no clubbing or cyanosis Skin - normal coloration and turgor  ED Course  Procedures (including critical care time) Labs Review Labs Reviewed - No data to display  Imaging Review No results found. I have personally reviewed and evaluated these images and lab results as part of my medical decision-making.   EKG Interpretation None      MDM   Final diagnoses:  Bronchitis  Viral infection    Pt presenting with cough and sore throat and congestion x 1 day.  She is requesting a work note which was provided.  No respiratory distress.  Will give albuterol inhaler to help with bronchitic type cough.   Patient is overall nontoxic and well hydrated in appearance.   Discharged with strict return precautions.  Pt agreeable with plan.     Jerelyn ScottMartha Linker, MD 02/25/16 [id number]

## 2016-02-28 ENCOUNTER — Encounter (HOSPITAL_BASED_OUTPATIENT_CLINIC_OR_DEPARTMENT_OTHER): Payer: Self-pay | Admitting: Emergency Medicine

## 2016-02-28 DIAGNOSIS — F17210 Nicotine dependence, cigarettes, uncomplicated: Secondary | ICD-10-CM | POA: Insufficient documentation

## 2016-02-28 DIAGNOSIS — J029 Acute pharyngitis, unspecified: Secondary | ICD-10-CM | POA: Insufficient documentation

## 2016-02-28 LAB — RAPID STREP SCREEN (MED CTR MEBANE ONLY): Streptococcus, Group A Screen (Direct): NEGATIVE

## 2016-02-28 NOTE — ED Notes (Signed)
Patient states that she started to have a sore throat 2 days ago. The patient reports that she woke up from a nap today and felt like she was having trouble breathing. Patient is in no distress at this time.

## 2016-02-29 ENCOUNTER — Emergency Department (HOSPITAL_BASED_OUTPATIENT_CLINIC_OR_DEPARTMENT_OTHER)
Admission: EM | Admit: 2016-02-29 | Discharge: 2016-02-29 | Disposition: A | Discharge status: Home / Self Care | Payer: Medicaid Other | Attending: Emergency Medicine | Admitting: Emergency Medicine

## 2016-03-02 LAB — CULTURE, GROUP A STREP (THRC)

## 2016-07-09 ENCOUNTER — Encounter (HOSPITAL_COMMUNITY): Payer: Self-pay | Admitting: Emergency Medicine

## 2016-07-09 ENCOUNTER — Emergency Department (HOSPITAL_COMMUNITY)
Admission: EM | Admit: 2016-07-09 | Discharge: 2016-07-09 | Disposition: A | Discharge status: Home / Self Care | Payer: Medicaid Other | Attending: Dermatology | Admitting: Dermatology

## 2016-07-09 DIAGNOSIS — R1011 Right upper quadrant pain: Secondary | ICD-10-CM | POA: Insufficient documentation

## 2016-07-09 DIAGNOSIS — Z5321 Procedure and treatment not carried out due to patient leaving prior to being seen by health care provider: Secondary | ICD-10-CM | POA: Insufficient documentation

## 2016-07-09 LAB — COMPREHENSIVE METABOLIC PANEL
ALT: 10 U/L — ABNORMAL LOW (ref 14–54)
ANION GAP: 6 (ref 5–15)
AST: 16 U/L (ref 15–41)
Albumin: 4.4 g/dL (ref 3.5–5.0)
Alkaline Phosphatase: 42 U/L (ref 38–126)
BILIRUBIN TOTAL: 0.7 mg/dL (ref 0.3–1.2)
BUN: 7 mg/dL (ref 6–20)
CHLORIDE: 106 mmol/L (ref 101–111)
CO2: 25 mmol/L (ref 22–32)
Calcium: 8.9 mg/dL (ref 8.9–10.3)
Creatinine, Ser: 0.74 mg/dL (ref 0.44–1.00)
Glucose, Bld: 93 mg/dL (ref 65–99)
POTASSIUM: 4.2 mmol/L (ref 3.5–5.1)
Sodium: 137 mmol/L (ref 135–145)
TOTAL PROTEIN: 7.3 g/dL (ref 6.5–8.1)

## 2016-07-09 LAB — CBC
HEMATOCRIT: 37 % (ref 36.0–46.0)
Hemoglobin: 12.3 g/dL (ref 12.0–15.0)
MCH: 30.3 pg (ref 26.0–34.0)
MCHC: 33.2 g/dL (ref 30.0–36.0)
MCV: 91.1 fL (ref 78.0–100.0)
Platelets: 380 10*3/uL (ref 150–400)
RBC: 4.06 MIL/uL (ref 3.87–5.11)
RDW: 12.9 % (ref 11.5–15.5)
WBC: 9.5 10*3/uL (ref 4.0–10.5)

## 2016-07-09 LAB — LIPASE, BLOOD: LIPASE: 18 U/L (ref 11–51)

## 2016-07-09 LAB — I-STAT BETA HCG BLOOD, ED (MC, WL, AP ONLY): I-stat hCG, quantitative: <5 m[IU]/mL (ref <5)

## 2016-07-09 NOTE — ED Triage Notes (Signed)
Pt returned labels to front desk clerk, pt stated she did not want to wait.

## 2016-07-09 NOTE — ED Triage Notes (Signed)
Per EMS. Pt reports worsening RUQ pain since yesterday accompanied by emesis. Went to [facility] and was given zofran at 1420, but left AMA

## 2016-09-13 IMAGING — US US TRANSVAGINAL NON-OB
1 series · 14 of 25 positions shown · non-contrast
Comparison: CT 01/23/2014

CLINICAL DATA: Right lower quadrant pain.  Onset this morning.

EXAM:
TRANSABDOMINAL AND TRANSVAGINAL ULTRASOUND OF PELVIS
TECHNIQUE: Both transabdominal and transvaginal ultrasound examinations of the
pelvis were performed. Transabdominal technique was performed for
global imaging of the pelvis including uterus, ovaries, adnexal
regions, and pelvic cul-de-sac. It was necessary to proceed with
endovaginal exam following the transabdominal exam to visualize the
uterus, endometrium and ovaries.

[Series 1: us transvaginal non-ob · 0.24mm/px · 14 of 54 slices shown]
[im 1/54]
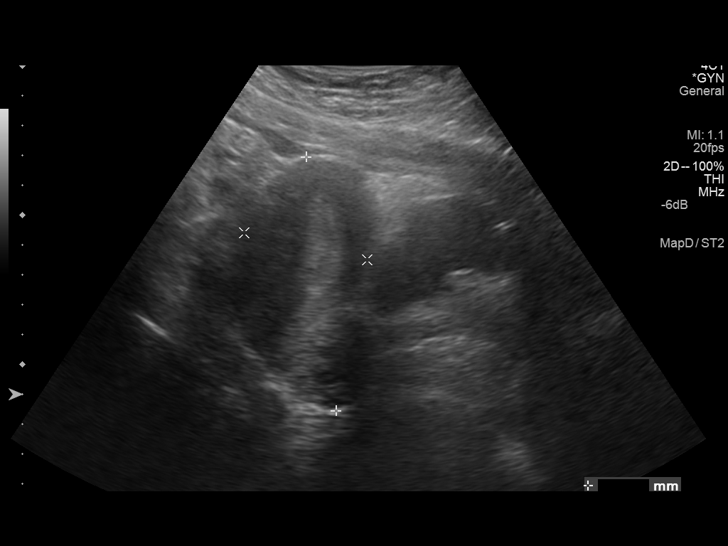
[im 5/54]
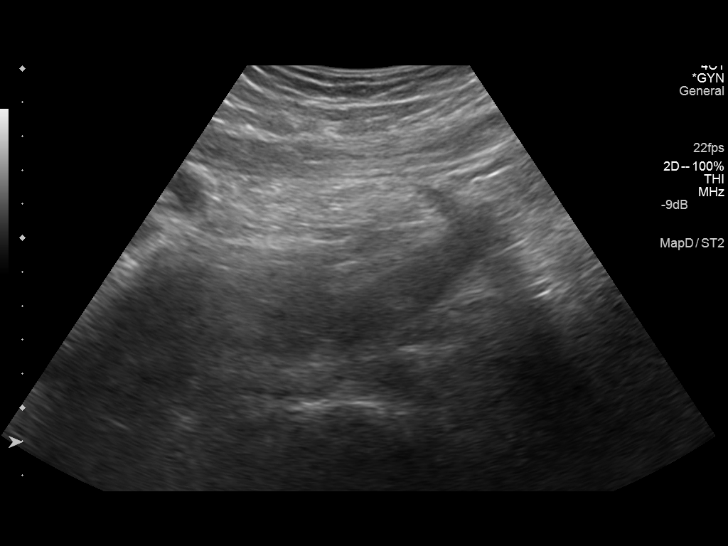
[im 9/54]
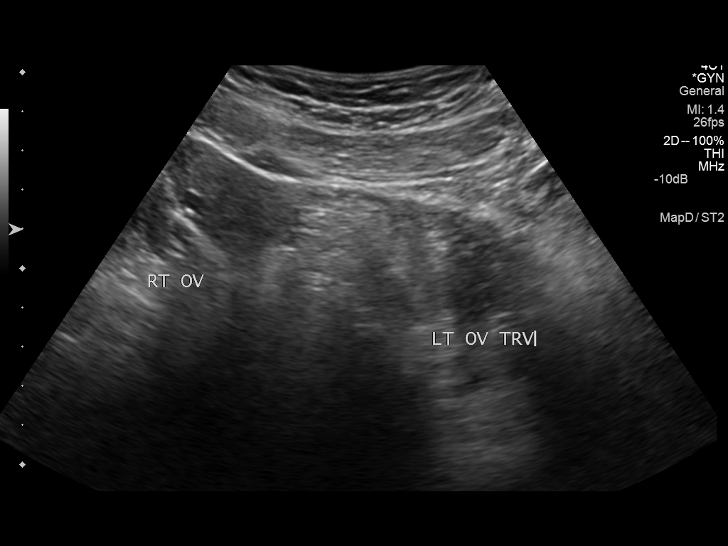
[im 14/54]
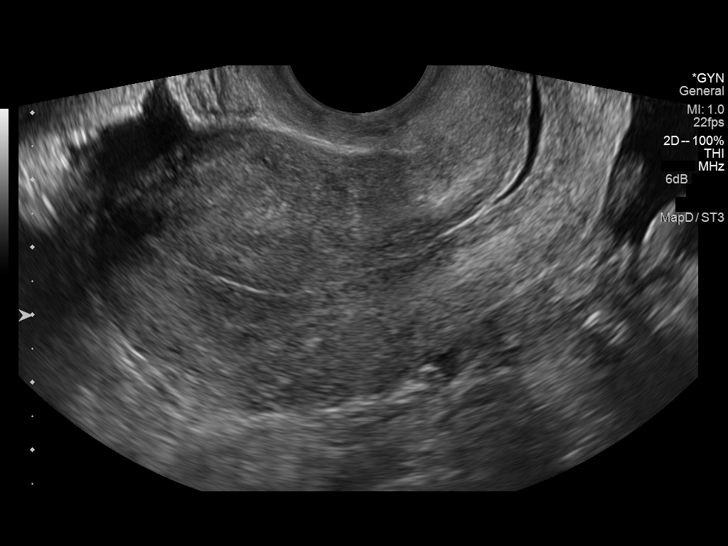
[im 18/54]
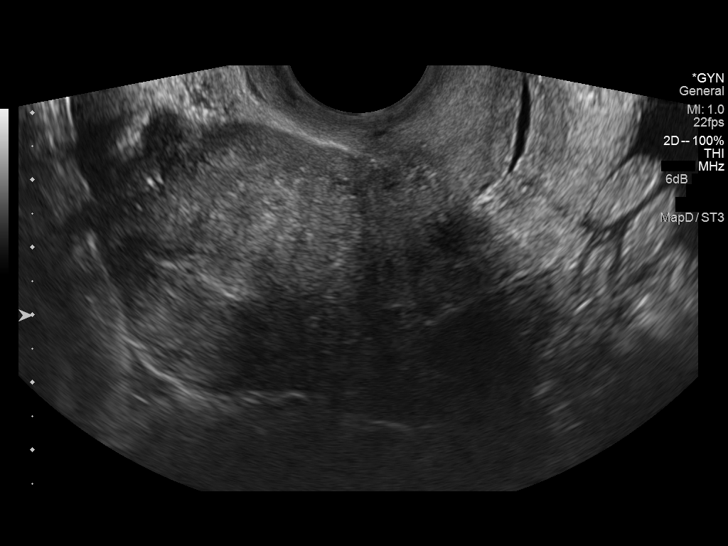
[im 20/54]
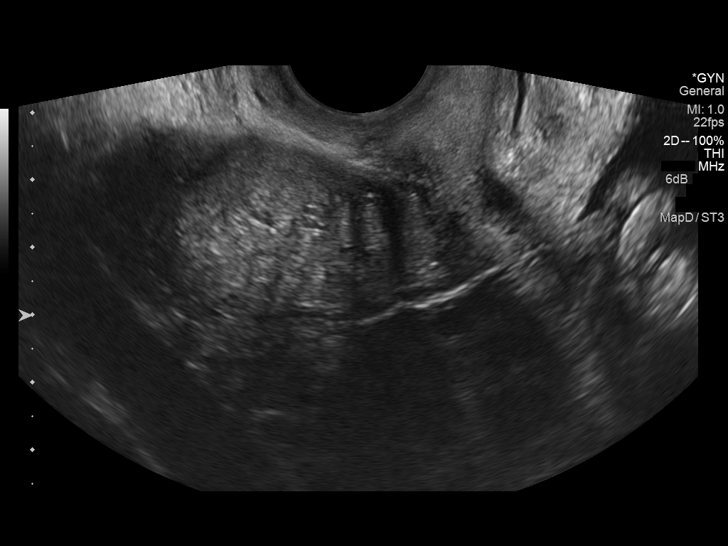
[im 25/54]
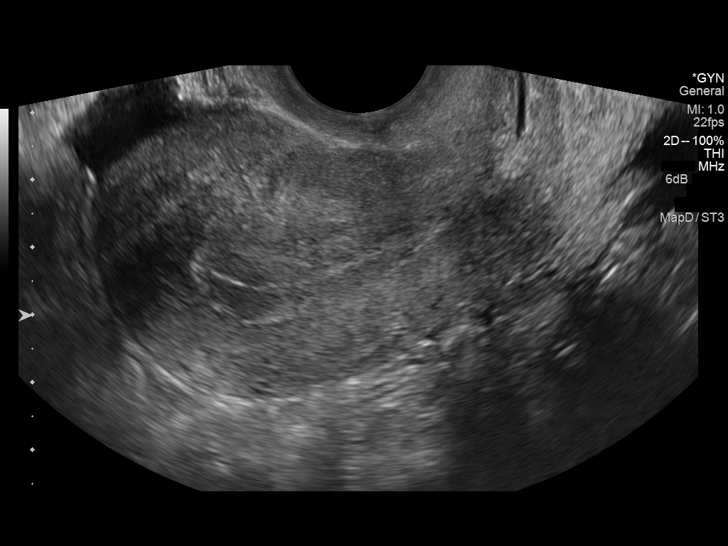
[im 29/54]
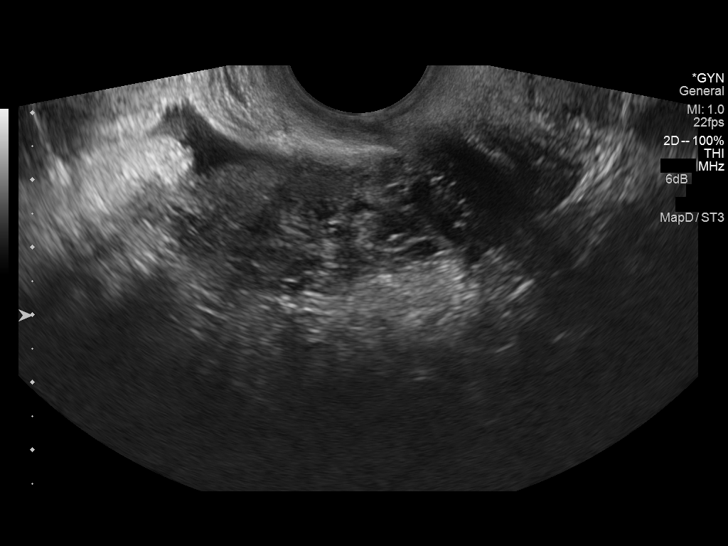
[im 34/54]
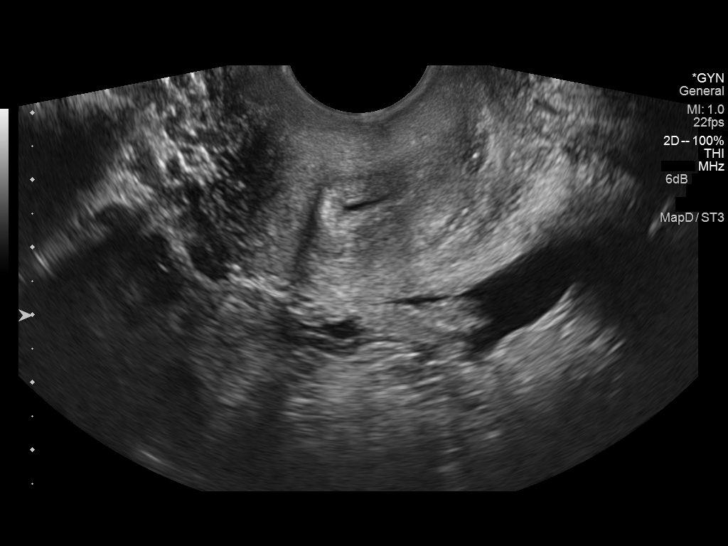
[im 36/54]
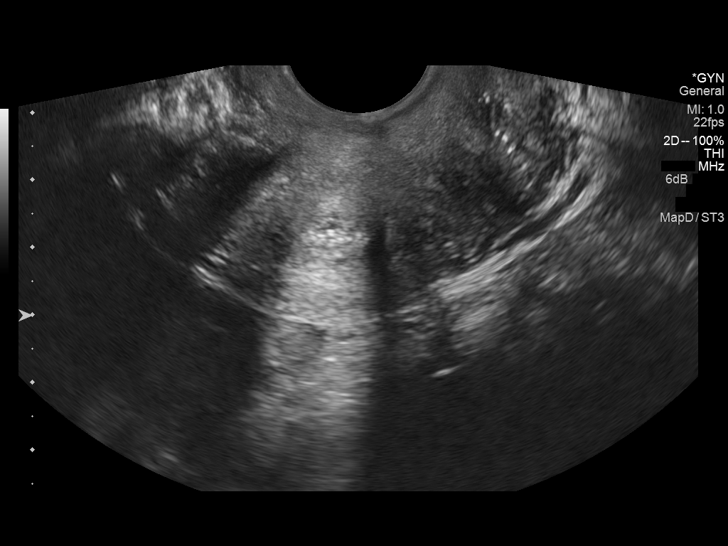
[im 40/54]
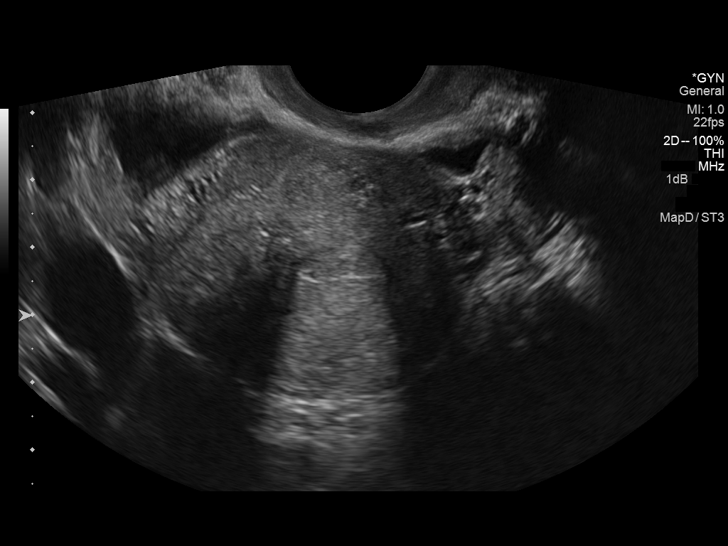
[im 45/54]
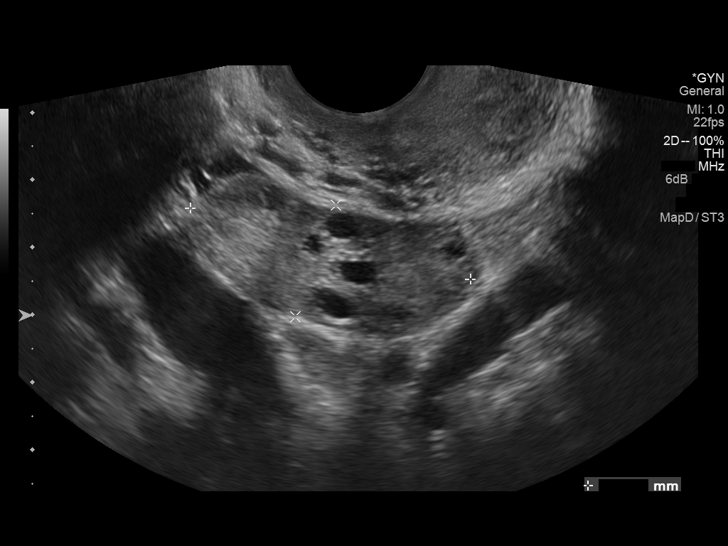
[im 49/54]
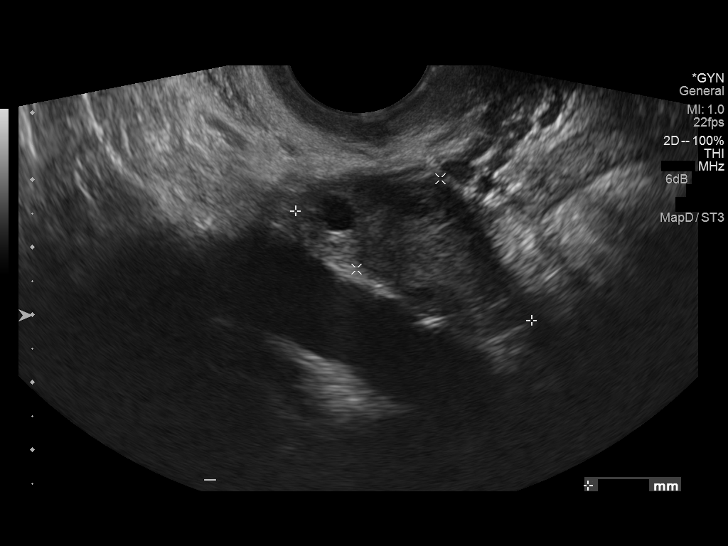
[im 54/54]
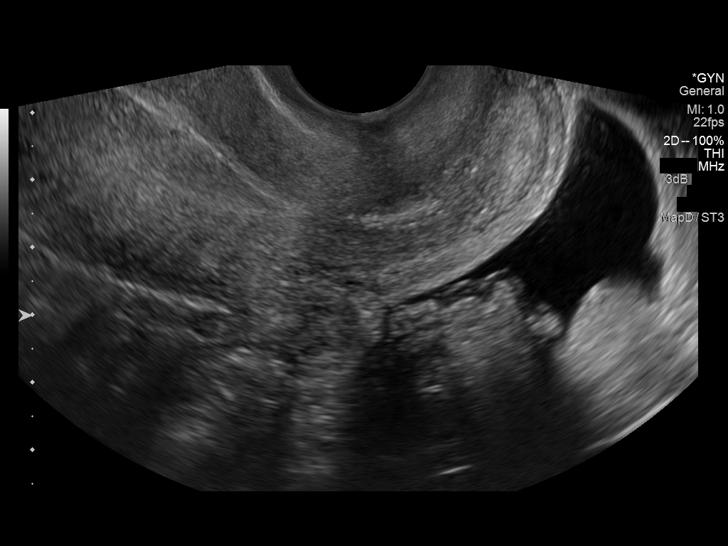

[14 of 25 positions shown; findings below may reference images not displayed]

FINDINGS: Uterus

Measurements: 8.5 x 4.3 x 5.1 cm. No fibroids or other mass
visualized. Normal anteverted position of the uterus.

Endometrium

Thickness: 0.9 cm.  No focal abnormality visualized.

Right ovary

Measurements: 4.3 x 1.8 x 2.8 cm. Normal appearance/no adnexal mass.

Left ovary

Measurements: 3.9 x 1.8 x 2.0 cm.. Normal appearance/no adnexal
mass.

Other findings

Small amount of free fluid.  This free fluid could be physiologic.
IMPRESSION: Normal pelvic ultrasound.

## 2016-09-14 ENCOUNTER — Encounter (HOSPITAL_BASED_OUTPATIENT_CLINIC_OR_DEPARTMENT_OTHER): Payer: Self-pay | Admitting: Emergency Medicine

## 2016-09-14 ENCOUNTER — Emergency Department (HOSPITAL_BASED_OUTPATIENT_CLINIC_OR_DEPARTMENT_OTHER)
Admission: EM | Admit: 2016-09-14 | Discharge: 2016-09-14 | Disposition: A | Discharge status: Home / Self Care | Payer: Medicaid Other | Attending: Emergency Medicine | Admitting: Emergency Medicine

## 2016-09-14 DIAGNOSIS — B9689 Other specified bacterial agents as the cause of diseases classified elsewhere: Secondary | ICD-10-CM

## 2016-09-14 DIAGNOSIS — H6122 Impacted cerumen, left ear: Secondary | ICD-10-CM | POA: Insufficient documentation

## 2016-09-14 DIAGNOSIS — F17210 Nicotine dependence, cigarettes, uncomplicated: Secondary | ICD-10-CM | POA: Insufficient documentation

## 2016-09-14 DIAGNOSIS — N760 Acute vaginitis: Secondary | ICD-10-CM | POA: Insufficient documentation

## 2016-09-14 DIAGNOSIS — H60392 Other infective otitis externa, left ear: Secondary | ICD-10-CM | POA: Insufficient documentation

## 2016-09-14 LAB — URINALYSIS, ROUTINE W REFLEX MICROSCOPIC
BILIRUBIN URINE: NEGATIVE
Glucose, UA: NEGATIVE mg/dL
HGB URINE DIPSTICK: NEGATIVE
Ketones, ur: NEGATIVE mg/dL
Leukocytes, UA: NEGATIVE
NITRITE: NEGATIVE
PROTEIN: NEGATIVE mg/dL
Specific Gravity, Urine: 1.018 (ref 1.005–1.030)
pH: 7.5 (ref 5.0–8.0)

## 2016-09-14 LAB — WET PREP, GENITAL
Sperm: NONE SEEN
Trich, Wet Prep: NONE SEEN
YEAST WET PREP: NONE SEEN

## 2016-09-14 LAB — PREGNANCY, URINE: PREG TEST UR: NEGATIVE

## 2016-09-14 MED ORDER — METRONIDAZOLE 500 MG PO TABS: 500.0000 mg | ORAL_TABLET | Freq: Two times a day (BID) | ORAL | 0 refills | Status: DC

## 2016-09-14 MED ORDER — NEOMYCIN-POLYMYXIN-HC 1 % OT SOLN
OTIC | Status: AC
  Administered 2016-09-14: 07:00:00
  Filled 2016-09-14: qty 10

## 2016-09-14 MED ORDER — NEOMYCIN-COLIST-HC-THONZONIUM 3.3-3-10-0.5 MG/ML OT SUSP
4.0000 [drp] | Freq: Four times a day (QID) | OTIC | Status: DC
  Filled 2016-09-14: qty 5

## 2016-09-14 NOTE — ED Triage Notes (Signed)
Pt reports ear pain onset x1 month after using q-tip to clean ear. Pain is progressively getting worse.

## 2016-09-14 NOTE — ED Provider Notes (Signed)
[facility] Provider Note: Lowella Dell, MD, FACEP  CSN: [id number] MRN: [id number] ARRIVAL: 09/14/16 at 0547   CHIEF COMPLAINT  Ear Pain   HISTORY OF PRESENT ILLNESS  Megan Hahn is a 20 y.o. female who is here with discomfort in her left external auditory canal for about the past month. She routinely cleans her ears with Q-tips. She believes she has gotten wax stuck in the left ear and the pain is increased the past several days. Pain is worse with manipulation of the ear, particularly when using a Q-tip. There is also associated itching. Symptoms are moderate. There has not been drainage from the ear.  She is also here with an abnormal discharge and odor similar to previous bacterial vaginosis.   Past Medical History:  Diagnosis Date  . STD (female)     History reviewed. No pertinent surgical history.  History reviewed. No pertinent family history.  Social History  Substance Use Topics  . Smoking status: Current Every Day Smoker    Packs/day: 0.25    Types: Cigarettes  . Smokeless tobacco: Not on file  . Alcohol use No    Prior to Admission medications   Medication Sig Start Date End Date Taking? Authorizing Provider  metroNIDAZOLE (FLAGYL) 500 MG tablet Take 1 tablet (500 mg total) by mouth 2 (two) times daily. One po bid x 7 days 09/14/16   Paula Libra, MD    Allergies Review of patient's allergies indicates no known allergies.   REVIEW OF SYSTEMS  Negative except as noted here or in the History of Present Illness.   PHYSICAL EXAMINATION  Initial Vital Signs Blood pressure 144/94, pulse 97, temperature 98.2 F (36.8 C), temperature source Oral, resp. rate 18, height 5\' 3"  (1.6 m), weight 170 lb (77.1 kg), last menstrual period 08/22/2016, SpO2 100 %.  Examination General: Well-developed, well-nourished female in no acute distress; appearance consistent with age of record HENT: normocephalic; atraumatic; note erythema of TMs; left TM  partially obscured by moist-appearing cerumen; pain on insertion of speculum into left ear Eyes: pupils equal, round and reactive to light; extraocular muscles intact Neck: supple Heart: regular rate and rhythm Lungs: clear to auscultation bilaterally Abdomen: soft; nondistended; nontender GU: Normal external genitalia; white vaginal discharge; no vaginal bleeding; no cervical motion tenderness; no adnexal tenderness Extremities: No deformity; full range of motion; pulses normal Neurologic: Awake, alert and oriented; motor function intact in all extremities and symmetric; no facial droop Skin: Warm and dry Psychiatric: Normal mood and affect   RESULTS  Summary of this visit's results, reviewed by myself:   EKG Interpretation  Date/Time:    Ventricular Rate:    PR Interval:    QRS Duration:   QT Interval:    QTC Calculation:   R Axis:     Text Interpretation:        Laboratory Studies: Results for orders placed or performed during the hospital encounter of 09/14/16 (from the past 24 hour(s))  Urinalysis, Routine w reflex microscopic (not at [facility])     Status: None   Collection Time: 09/14/16  6:17 AM  Result Value Ref Range   Color, Urine YELLOW YELLOW   APPearance CLEAR CLEAR   Specific Gravity, Urine 1.018 1.005 - 1.030   pH 7.5 5.0 - 8.0   Glucose, UA NEGATIVE NEGATIVE mg/dL   Hgb urine dipstick NEGATIVE NEGATIVE   Bilirubin Urine NEGATIVE NEGATIVE   Ketones, ur NEGATIVE NEGATIVE mg/dL   Protein, ur NEGATIVE NEGATIVE mg/dL  Nitrite NEGATIVE NEGATIVE   Leukocytes, UA NEGATIVE NEGATIVE  Pregnancy, urine     Status: None   Collection Time: 09/14/16  6:17 AM  Result Value Ref Range   Preg Test, Ur NEGATIVE NEGATIVE  Wet prep, genital     Status: Abnormal   Collection Time: 09/14/16  6:19 AM  Result Value Ref Range   Yeast Wet Prep HPF POC NONE SEEN NONE SEEN   Trich, Wet Prep NONE SEEN NONE SEEN   Clue Cells Wet Prep HPF POC PRESENT (A) NONE SEEN   WBC, Wet Prep  HPF POC FEW (A) NONE SEEN   Sperm NONE SEEN    Imaging Studies: No results found.  ED COURSE  Nursing notes and initial vitals signs, including pulse oximetry, reviewed.  Vitals:   09/14/16 0555  BP: 144/94  Pulse: 97  Resp: 18  Temp: 98.2 F (36.8 C)  TempSrc: Oral  SpO2: 100%  Weight: 170 lb (77.1 kg)  Height: 5\' 3"  (1.6 m)    PROCEDURES    ED DIAGNOSES     ICD-9-CM ICD-10-CM   1. Otitis externa, chronic infective, left 380.16 H60.392   2. Bacterial vaginosis 616.10 N76.0    041.9 B96.89   3. Impacted cerumen of left ear 380.4 H61.22        Paula LibraJohn Cassidey Barrales, MD 09/14/16 [phone]

## 2016-09-17 LAB — GC/CHLAMYDIA PROBE AMP (~~LOC~~) NOT AT ARMC
Chlamydia: NEGATIVE
Neisseria Gonorrhea: NEGATIVE

## 2017-01-01 ENCOUNTER — Encounter (HOSPITAL_BASED_OUTPATIENT_CLINIC_OR_DEPARTMENT_OTHER): Payer: Self-pay

## 2017-01-01 DIAGNOSIS — F17210 Nicotine dependence, cigarettes, uncomplicated: Secondary | ICD-10-CM | POA: Insufficient documentation

## 2017-01-01 DIAGNOSIS — N898 Other specified noninflammatory disorders of vagina: Secondary | ICD-10-CM | POA: Insufficient documentation

## 2017-01-01 DIAGNOSIS — Z5321 Procedure and treatment not carried out due to patient leaving prior to being seen by health care provider: Secondary | ICD-10-CM | POA: Insufficient documentation

## 2017-01-01 NOTE — ED Triage Notes (Signed)
C/o vaginal d/c x 5 days-NAD-steady gait

## 2017-01-02 ENCOUNTER — Emergency Department (HOSPITAL_BASED_OUTPATIENT_CLINIC_OR_DEPARTMENT_OTHER)
Admission: EM | Admit: 2017-01-02 | Discharge: 2017-01-02 | Disposition: A | Discharge status: Home / Self Care | Payer: Medicaid Other | Attending: Dermatology | Admitting: Dermatology

## 2017-01-02 LAB — URINALYSIS, ROUTINE W REFLEX MICROSCOPIC
BILIRUBIN URINE: NEGATIVE
Glucose, UA: NEGATIVE mg/dL
KETONES UR: NEGATIVE mg/dL
NITRITE: NEGATIVE
PH: 7 (ref 5.0–8.0)
Protein, ur: NEGATIVE mg/dL
Specific Gravity, Urine: 1.029 (ref 1.005–1.030)

## 2017-01-02 LAB — PREGNANCY, URINE: Preg Test, Ur: NEGATIVE

## 2017-01-02 LAB — URINALYSIS, MICROSCOPIC (REFLEX)

## 2017-05-06 ENCOUNTER — Emergency Department (HOSPITAL_BASED_OUTPATIENT_CLINIC_OR_DEPARTMENT_OTHER)
Admission: EM | Admit: 2017-05-06 | Discharge: 2017-05-07 | Disposition: A | Discharge status: Home / Self Care | Payer: Medicaid Other | Attending: Emergency Medicine | Admitting: Emergency Medicine

## 2017-05-06 ENCOUNTER — Encounter (HOSPITAL_BASED_OUTPATIENT_CLINIC_OR_DEPARTMENT_OTHER): Payer: Self-pay

## 2017-05-06 DIAGNOSIS — N760 Acute vaginitis: Secondary | ICD-10-CM | POA: Insufficient documentation

## 2017-05-06 DIAGNOSIS — F17210 Nicotine dependence, cigarettes, uncomplicated: Secondary | ICD-10-CM | POA: Insufficient documentation

## 2017-05-06 DIAGNOSIS — B9689 Other specified bacterial agents as the cause of diseases classified elsewhere: Secondary | ICD-10-CM

## 2017-05-06 DIAGNOSIS — H9201 Otalgia, right ear: Secondary | ICD-10-CM | POA: Insufficient documentation

## 2017-05-06 LAB — URINALYSIS, ROUTINE W REFLEX MICROSCOPIC
BILIRUBIN URINE: NEGATIVE
GLUCOSE, UA: NEGATIVE mg/dL
HGB URINE DIPSTICK: NEGATIVE
KETONES UR: NEGATIVE mg/dL
LEUKOCYTES UA: NEGATIVE
Nitrite: NEGATIVE
PROTEIN: NEGATIVE mg/dL
Specific Gravity, Urine: 1.02 (ref 1.005–1.030)
pH: 6 (ref 5.0–8.0)

## 2017-05-06 LAB — PREGNANCY, URINE: Preg Test, Ur: NEGATIVE

## 2017-05-06 NOTE — ED Triage Notes (Signed)
Pt c/o rt ear pain [id number], worse tonight but also is having vaginal d/c with an odor, wants STD testing

## 2017-05-07 ENCOUNTER — Emergency Department (HOSPITAL_BASED_OUTPATIENT_CLINIC_OR_DEPARTMENT_OTHER)
Admission: EM | Admit: 2017-05-07 | Discharge: 2017-05-07 | Disposition: A | Discharge status: Home / Self Care | Payer: Self-pay | Attending: Emergency Medicine | Admitting: Emergency Medicine

## 2017-05-07 ENCOUNTER — Encounter (HOSPITAL_BASED_OUTPATIENT_CLINIC_OR_DEPARTMENT_OTHER): Payer: Self-pay | Admitting: *Deleted

## 2017-05-07 DIAGNOSIS — F17210 Nicotine dependence, cigarettes, uncomplicated: Secondary | ICD-10-CM | POA: Insufficient documentation

## 2017-05-07 DIAGNOSIS — H9203 Otalgia, bilateral: Secondary | ICD-10-CM | POA: Insufficient documentation

## 2017-05-07 DIAGNOSIS — Z202 Contact with and (suspected) exposure to infections with a predominantly sexual mode of transmission: Secondary | ICD-10-CM | POA: Insufficient documentation

## 2017-05-07 DIAGNOSIS — N760 Acute vaginitis: Secondary | ICD-10-CM | POA: Insufficient documentation

## 2017-05-07 LAB — WET PREP, GENITAL
SPERM: NONE SEEN
TRICH WET PREP: NONE SEEN
Yeast Wet Prep HPF POC: NONE SEEN

## 2017-05-07 MED ORDER — METRONIDAZOLE 500 MG PO TABS: 500.0000 mg | ORAL_TABLET | Freq: Two times a day (BID) | ORAL | 0 refills | Status: AC

## 2017-05-07 MED ORDER — CEFTRIAXONE SODIUM 250 MG IJ SOLR
250.0000 mg | Freq: Once | INTRAMUSCULAR | Status: AC
  Administered 2017-05-07: 250 mg via INTRAMUSCULAR
  Filled 2017-05-07: qty 250

## 2017-05-07 MED ORDER — AZITHROMYCIN 250 MG PO TABS
1000.0000 mg | ORAL_TABLET | Freq: Once | ORAL | Status: AC
  Administered 2017-05-07: 1000 mg via ORAL
  Filled 2017-05-07: qty 4

## 2017-05-07 MED ORDER — METRONIDAZOLE 500 MG PO TABS
500.0000 mg | ORAL_TABLET | Freq: Once | ORAL | Status: AC
  Administered 2017-05-07: 500 mg via ORAL
  Filled 2017-05-07: qty 1

## 2017-05-07 NOTE — ED Provider Notes (Signed)
[facility] Provider Note: Lowella Dell, MD, FACEP  CSN: [id number] MRN: [id number] ARRIVAL: 05/06/17 at 2254 ROOM: MH09/MH09   CHIEF COMPLAINT  Ear Pain   HISTORY OF PRESENT ILLNESS  Megan Hahn is a 21 y.o. female with a history of chronic otitis media. She is here complaining of several months of itching in her ear canals, left greater than right. She denies pain. She denies drainage. She has been treated for cerumen impaction in the past but has avoided using cotton swabs since her last cerumen disimpaction.   To me her chief complaint is actually abnormal vaginal odor for several days. She has a history of bacterial vaginosis as well as yeast infections. She denies any vulvovaginal irritation or pain. She has not noticed any vaginal bleeding or significant discharge.    Past Medical History:  Diagnosis Date  . STD (female)     History reviewed. No pertinent surgical history.  No family history on file.  Social History  Substance Use Topics  . Smoking status: Current Every Day Smoker    Packs/day: 0.25    Types: Cigarettes  . Smokeless tobacco: Never Used  . Alcohol use No    Prior to Admission medications   Not on File    Allergies Patient has no known allergies.   REVIEW OF SYSTEMS  Negative except as noted here or in the History of Present Illness.   PHYSICAL EXAMINATION  Initial Vital Signs Blood pressure (!) 143/82, pulse 71, temperature 98.7 F (37.1 C), temperature source Oral, resp. rate 16, height 5\' 3"  (1.6 m), weight 86.2 kg (190 lb), last menstrual period 04/22/2017, SpO2 98 %.  Examination General: Well-developed, well-nourished female in no acute distress; appearance consistent with age of record HENT: normocephalic; atraumatic; TMs normal apart from chronic appearing scarring of the left TM; minimal wax in external auditory canals with TMs easily visualized; no erythema or exudative external auditory canals, no pain on  movement of external ears Eyes: Normal appearance Neck: supple Heart: regular rate and rhythm Lungs: clear to auscultation bilaterally Abdomen: soft; nondistended; nontender; no masses or hepatosplenomegaly; bowel sounds present GU: Normal external genitalia; scant white vaginal discharge; no vaginal bleeding; no cervical motion tenderness; no adnexal tenderness Extremities: No deformity; full range of motion; pulses normal Neurologic: Awake, alert and oriented; motor function intact in all extremities and symmetric; no facial droop Skin: Warm and dry Psychiatric: Normal mood and affect   RESULTS  Summary of this visit's results, reviewed by myself:   EKG Interpretation  Date/Time:    Ventricular Rate:    PR Interval:    QRS Duration:   QT Interval:    QTC Calculation:   R Axis:     Text Interpretation:        Laboratory Studies: Results for orders placed or performed during the hospital encounter of 05/06/17 (from the past 24 hour(s))  Urinalysis, Routine w reflex microscopic     Status: Abnormal   Collection Time: 05/06/17 11:19 PM  Result Value Ref Range   Color, Urine YELLOW YELLOW   APPearance CLOUDY (A) CLEAR   Specific Gravity, Urine 1.020 1.005 - 1.030   pH 6.0 5.0 - 8.0   Glucose, UA NEGATIVE NEGATIVE mg/dL   Hgb urine dipstick NEGATIVE NEGATIVE   Bilirubin Urine NEGATIVE NEGATIVE   Ketones, ur NEGATIVE NEGATIVE mg/dL   Protein, ur NEGATIVE NEGATIVE mg/dL   Nitrite NEGATIVE NEGATIVE   Leukocytes, UA NEGATIVE NEGATIVE  Pregnancy, urine     Status:  None   Collection Time: 05/06/17 11:19 PM  Result Value Ref Range   Preg Test, Ur NEGATIVE NEGATIVE  Wet prep, genital     Status: Abnormal   Collection Time: 05/07/17 12:40 AM  Result Value Ref Range   Yeast Wet Prep HPF POC NONE SEEN NONE SEEN   Trich, Wet Prep NONE SEEN NONE SEEN   Clue Cells Wet Prep HPF POC PRESENT (A) NONE SEEN   WBC, Wet Prep HPF POC FEW (A) NONE SEEN   Sperm NONE SEEN    Imaging  Studies: No results found.  ED COURSE  Nursing notes and initial vitals signs, including pulse oximetry, reviewed.  Vitals:   05/06/17 2301 05/06/17 2305  BP: (!) 143/82   Pulse: 71   Resp: 16   Temp: 98.7 F (37.1 C)   TempSrc: Oral   SpO2: 98%   Weight:  86.2 kg (190 lb)  Height:  5\' 3"  (1.6 m)    PROCEDURES    ED DIAGNOSES     ICD-9-CM ICD-10-CM   1. BV (bacterial vaginosis) 616.10 N76.0    041.9 B96.89        Caera Enwright, MD 05/07/17 [phone]

## 2017-05-07 NOTE — ED Provider Notes (Signed)
TIME SEEN: 11:17 PM  CHIEF COMPLAINT: Exposure to STD  HPI: Megan Hahn is a 21 y.o. female with a PMHx significant for ovarian cysts and chlamydia, who presents to the Emergency Department with complaints of exposure to a STD. Pt received a call from her boyfriend's other sexual partner stating that she had recently been diagnosed with chlamydia.  Pt was seen and d/c yesterday for abnormal vaginal odor, and had a pelvic exam at that time.  Dx with BV.  Being treated with metronidazole. STD screening sent but have not resulted. Sx have not worsened.  She would like to be preemptively treated for chlamydia and gonorrhea tonight.  Pt denies experiencing any other acute sx, including abdominal pain, pelvic pain, nausea, vomiting, diarrhea or fevers. No hx of allergies. LKMP was x2 weeks ago.  She denies any other acute complaints at this time.   ROS: See HPI Constitutional: no fever  Eyes: no drainage  ENT: no runny nose   Cardiovascular:  no chest pain  Resp: no SOB  GI: no vomiting GU: no dysuria Integumentary: no rash  Allergy: no hives  Musculoskeletal: no leg swelling  Neurological: no slurred speech ROS otherwise negative  PAST MEDICAL HISTORY/PAST SURGICAL HISTORY:  Past Medical History:  Diagnosis Date  . STD (female)     MEDICATIONS:  Prior to Admission medications   Medication Sig Start Date End Date Taking? Authorizing Provider  metroNIDAZOLE (FLAGYL) 500 MG tablet Take 1 tablet (500 mg total) by mouth 2 (two) times daily. One po bid x 7 days 05/07/17   Molpus, Jonny RuizJohn, MD    ALLERGIES:  No Known Allergies  SOCIAL HISTORY:  Social History  Substance Use Topics  . Smoking status: Current Every Day Smoker    Packs/day: 0.25    Types: Cigarettes  . Smokeless tobacco: Never Used  . Alcohol use No    FAMILY HISTORY: History reviewed. No pertinent family history.  EXAM: BP (!) 141/91   Pulse 97   Temp 98.2 F (36.8 C) (Oral)   Resp 16   LMP 04/22/2017    SpO2 100%  CONSTITUTIONAL: Alert and oriented and responds appropriately to questions. Well-appearing; well-nourished, Tearful, afebrile and nontoxic HEAD: Normocephalic EYES: Conjunctivae clear, pupils appear equal, EOMI ENT: normal nose; moist mucous membranes NECK: Supple, no meningismus, no nuchal rigidity, no LAD  CARD: RRR; S1 and S2 appreciated; no murmurs, no clicks, no rubs, no gallops RESP: Normal chest excursion without splinting or tachypnea; breath sounds clear and equal bilaterally; no wheezes, no rhonchi, no rales, no hypoxia or respiratory distress, speaking full sentences ABD/GI: Normal bowel sounds; non-distended; soft, non-tender, no rebound, no guarding, no peritoneal signs, no hepatosplenomegaly BACK:  The back appears normal and is non-tender to palpation, there is no CVA tenderness EXT: Normal ROM in all joints; non-tender to palpation; no edema; normal capillary refill; no cyanosis, no calf tenderness or swelling    SKIN: Normal color for age and race; warm; no rash NEURO: Moves all extremities equally PSYCH: The patient's mood and manner are appropriate. Grooming and personal hygiene are appropriate.  MEDICAL DECISION MAKING: Patient here with concerns for STD exposure. Was seen here yesterday for abnormal vaginal discharge and diagnosed with BV. Denies abdominal pain, fever, vomiting. We'll treat empirically for gonorrhea and chlamydia.  Will give outpatient follow-up for STD clinic. Doubt torsion, ovarian cysts, TOA, PID given no pain or significant discharge currently. Just had a pelvic exam yesterday. I do not feel she needs emergent imaging, labs. Urine  pregnant test yesterday was negative.  I have advised her to avoid any sexual intercourse for the next week and that all of her sexual partners will need to be tested and/or treated as well.  At this time, I do not feel there is any life-threatening condition present. I have reviewed and discussed all results (EKG,  imaging, lab, urine as appropriate) and exam findings with patient/family. I have reviewed nursing notes and appropriate previous records.  I feel the patient is safe to be discharged home without further emergent workup and can continue workup as an outpatient as needed. Discussed usual and customary return precautions. Patient/family verbalize understanding and are comfortable with this plan.  Outpatient follow-up has been provided if needed. All questions have been answered.   By signing my name below, I, Phillips Climes, attest that this documentation has been prepared under the direction and in the presence of Mikeal Winstanley, Layla Maw, DO . Electronically Signed: Phillips Climes, Scribe. 05/07/2017. 11:36 PM.     Raelyn Number, DO 05/07/17 2349

## 2017-05-07 NOTE — Discharge Instructions (Signed)
You have been treated today for gonorrhea and chlamydia.  Please avoid all sexual intercourse including vaginal, oral and anal sex for at least one week. Any recent sexual partners will need to be tested and treated as well.  STD screening is available at the health department.  Please continue your metronidazole for bacterial vaginosis until complete.

## 2017-05-07 NOTE — ED Notes (Signed)
Pt verbalizes understanding of d/c instructions and denies any further needs at this time. 

## 2017-05-07 NOTE — ED Triage Notes (Signed)
Pt states partner was tested positive for chlamydia today was seen here yesterday  With Test pending but request tx now

## 2017-05-08 LAB — GC/CHLAMYDIA PROBE AMP (~~LOC~~) NOT AT ARMC
CHLAMYDIA, DNA PROBE: NEGATIVE
NEISSERIA GONORRHEA: NEGATIVE

## 2017-11-13 ENCOUNTER — Encounter (HOSPITAL_BASED_OUTPATIENT_CLINIC_OR_DEPARTMENT_OTHER): Payer: Self-pay | Admitting: Emergency Medicine

## 2017-11-13 ENCOUNTER — Emergency Department (HOSPITAL_BASED_OUTPATIENT_CLINIC_OR_DEPARTMENT_OTHER)
Admission: EM | Admit: 2017-11-13 | Discharge: 2017-11-13 | Disposition: A | Discharge status: Home / Self Care | Payer: Self-pay | Attending: Emergency Medicine | Admitting: Emergency Medicine

## 2017-11-13 ENCOUNTER — Other Ambulatory Visit: Payer: Self-pay

## 2017-11-13 DIAGNOSIS — K529 Noninfective gastroenteritis and colitis, unspecified: Secondary | ICD-10-CM | POA: Insufficient documentation

## 2017-11-13 DIAGNOSIS — F17210 Nicotine dependence, cigarettes, uncomplicated: Secondary | ICD-10-CM | POA: Insufficient documentation

## 2017-11-13 NOTE — ED Provider Notes (Signed)
[facility] EMERGENCY DEPARTMENT Provider Note   CSN: [id number] Arrival date & time: 11/13/17  1851     History   Chief Complaint Chief Complaint  Patient presents with  . Needs doctors note    HPI Megan Hahn is a 21 y.o. female. Chief complaint is vomiting-resolved.  HPI:  21 year old female. Was vomiting Monday night or yesterday. Didn't go to work is sick. Wants to elect work tomorrow but needs "okay to go back". States she thought she had a bunch of vomiting diarrhea yesterday but is eating well and normally today.  Past Medical History:  Diagnosis Date  . STD (female)     There are no active problems to display for this patient.   History reviewed. No pertinent surgical history.  OB History    No data available       Home Medications    Prior to Admission medications   Medication Sig Start Date End Date Taking? Authorizing Provider  metroNIDAZOLE (FLAGYL) 500 MG tablet Take 1 tablet (500 mg total) by mouth 2 (two) times daily. One po bid x 7 days 05/07/17   Molpus, John, MD    Family History No family history on file.  Social History Social History   Tobacco Use  . Smoking status: Current Every Day Smoker    Packs/day: 0.25    Types: Cigarettes  . Smokeless tobacco: Never Used  Substance Use Topics  . Alcohol use: No  . Drug use: No     Allergies   Patient has no known allergies.   Review of Systems Review of Systems  Constitutional: Negative for appetite change, chills, diaphoresis, fatigue and fever.  HENT: Negative for mouth sores, sore throat and trouble swallowing.   Eyes: Negative for visual disturbance.  Respiratory: Negative for cough, chest tightness, shortness of breath and wheezing.   Cardiovascular: Negative for chest pain.  Gastrointestinal: Positive for diarrhea and vomiting. Negative for abdominal distention, abdominal pain and nausea.  Endocrine: Negative for polydipsia, polyphagia and polyuria.    Genitourinary: Negative for dysuria, frequency and hematuria.  Musculoskeletal: Negative for gait problem.  Skin: Negative for color change, pallor and rash.  Neurological: Negative for dizziness, syncope, light-headedness and headaches.  Hematological: Does not bruise/bleed easily.  Psychiatric/Behavioral: Negative for behavioral problems and confusion.     Physical Exam Updated Vital Signs BP (!) 148/80 (BP Location: Left Arm)   Pulse 75   Temp 98.2 F (36.8 C) (Oral)   Resp 18   LMP 10/24/2017   SpO2 100%   Physical Exam  Constitutional: She is oriented to person, place, and time. She appears well-developed and well-nourished. No distress.  HENT:  Head: Normocephalic.  Eyes: Conjunctivae are normal. Pupils are equal, round, and reactive to light. No scleral icterus.  Neck: Normal range of motion. Neck supple. No thyromegaly present.  Cardiovascular: Normal rate and regular rhythm. Exam reveals no gallop and no friction rub.  No murmur heard. Pulmonary/Chest: Effort normal and breath sounds normal. No respiratory distress. She has no wheezes. She has no rales.  Abdominal: Soft. Bowel sounds are normal. She exhibits no distension. There is no tenderness. There is no rebound.  Musculoskeletal: Normal range of motion.  Neurological: She is alert and oriented to person, place, and time.  Skin: Skin is warm and dry. No rash noted.  Psychiatric: She has a normal mood and affect. Her behavior is normal.     ED Treatments / Results  Labs (all labs ordered are listed, but only  abnormal results are displayed) Labs Reviewed - No data to display  EKG  EKG Interpretation None       Radiology No results found.  Procedures Procedures (including critical care time)  Medications Ordered in ED Medications - No data to display   Initial Impression / Assessment and Plan / ED Course  I have reviewed the triage vital signs and the nursing notes.  Pertinent labs & imaging  results that were available during my care of the patient were reviewed by me and considered in my medical decision making (see chart for details).    OK to Return to work.  Final Clinical Impressions(s) / ED Diagnoses   Final diagnoses:  Gastroenteritis    ED Discharge Orders    None       Rolland PorterJames, Nijae Doyel, MD 11/13/17 1930

## 2017-11-13 NOTE — Discharge Instructions (Signed)
Resume normal diet. OK to return to without restrictions.

## 2017-11-13 NOTE — ED Triage Notes (Addendum)
Pt stayed home sick from work Tuesday for vomiting. States she is better but is not allowed to return to work without a note.

## 2017-11-13 NOTE — ED Notes (Addendum)
Nurse first-pt entered ED WR with steady gait-NAD-drinking coffee/milk shake drink-states she needs work note due to she is vomiting and unable to work-advised by reg clerk that she would need to check in to be seen with possibility of RTW note

## 2021-02-19 ENCOUNTER — Other Ambulatory Visit: Payer: Self-pay

## 2021-02-19 ENCOUNTER — Emergency Department (HOSPITAL_BASED_OUTPATIENT_CLINIC_OR_DEPARTMENT_OTHER)
Admission: EM | Admit: 2021-02-19 | Discharge: 2021-02-20 | Disposition: A | Discharge status: Home / Self Care | Payer: Medicaid Other | Attending: Emergency Medicine | Admitting: Emergency Medicine

## 2021-02-19 ENCOUNTER — Encounter (HOSPITAL_BASED_OUTPATIENT_CLINIC_OR_DEPARTMENT_OTHER): Payer: Self-pay | Admitting: *Deleted

## 2021-02-19 DIAGNOSIS — K0889 Other specified disorders of teeth and supporting structures: Secondary | ICD-10-CM | POA: Diagnosis present

## 2021-02-19 DIAGNOSIS — R519 Headache, unspecified: Secondary | ICD-10-CM | POA: Diagnosis not present

## 2021-02-19 DIAGNOSIS — F17210 Nicotine dependence, cigarettes, uncomplicated: Secondary | ICD-10-CM | POA: Insufficient documentation

## 2021-02-19 MED ORDER — KETOROLAC TROMETHAMINE 15 MG/ML IJ SOLN
15.0000 mg | Freq: Once | INTRAMUSCULAR | Status: AC
  Administered 2021-02-20: 15 mg via INTRAMUSCULAR
  Filled 2021-02-19: qty 1

## 2021-02-19 MED ORDER — OXYCODONE-ACETAMINOPHEN 5-325 MG PO TABS: 1.0000 | ORAL_TABLET | Freq: Four times a day (QID) | ORAL | 0 refills | Status: AC | PRN

## 2021-02-19 MED ORDER — OXYCODONE-ACETAMINOPHEN 5-325 MG PO TABS
1.0000 | ORAL_TABLET | Freq: Once | ORAL | Status: AC
  Administered 2021-02-20: 1 via ORAL
  Filled 2021-02-19: qty 1

## 2021-02-19 NOTE — ED Triage Notes (Signed)
Pt reports right upper tooth pain today. States she saw her dentist last month and was told she needs a cleaning. Denies broken tooth or injury

## 2021-02-19 NOTE — ED Provider Notes (Signed)
[facility] EMERGENCY DEPARTMENT Provider Note   CSN: [id number] Arrival date & time: 02/19/21  2330     History Chief Complaint  Patient presents with  . Dental Pain    Megan Hahn is a 25 y.o. female.   Dental Pain Location:  Upper Upper teeth location:  3/RU 1st molar and 2/RU 2nd molar Quality:  Aching Severity:  Severe Onset quality:  Gradual Duration:  1 day Timing:  Constant Progression:  Worsening Chronicity:  New Context: not dental fracture, not enamel fracture, filling intact, not intrusion, not malocclusion and not trauma   Relieved by:  Nothing Worsened by:  Nothing Ineffective treatments:  NSAIDs and topical anesthetic gel Associated symptoms: facial pain   Associated symptoms: no congestion, no fever and no headaches        Past Medical History:  Diagnosis Date  . STD (female)     There are no problems to display for this patient.   History reviewed. No pertinent surgical history.   OB History   No obstetric history on file.     No family history on file.  Social History   Tobacco Use  . Smoking status: Current Every Day Smoker    Packs/day: 0.25    Types: Cigarettes  . Smokeless tobacco: Never Used  Vaping Use  . Vaping Use: Never used  Substance Use Topics  . Alcohol use: No  . Drug use: Yes    Types: Marijuana    Home Medications Prior to Admission medications   Medication Sig Start Date End Date Taking? Authorizing Provider  oxyCODONE-acetaminophen (PERCOCET/ROXICET) 5-325 MG tablet Take 1 tablet by mouth every 6 (six) hours as needed for up to 6 doses for severe pain. 02/19/21  Yes Sabino Donovan, MD  metroNIDAZOLE (FLAGYL) 500 MG tablet Take 1 tablet (500 mg total) by mouth 2 (two) times daily. One po bid x 7 days 05/07/17   Molpus, Jonny Ruiz, MD    Allergies    Patient has no known allergies.  Review of Systems   Review of Systems  Constitutional: Negative for chills and fever.  HENT: Positive for dental  problem. Negative for congestion and rhinorrhea.   Respiratory: Negative for cough and shortness of breath.   Cardiovascular: Negative for chest pain and palpitations.  Gastrointestinal: Negative for diarrhea, nausea and vomiting.  Genitourinary: Negative for difficulty urinating and dysuria.  Musculoskeletal: Negative for arthralgias and back pain.  Skin: Negative for rash and wound.  Neurological: Negative for light-headedness and headaches.    Physical Exam Updated Vital Signs BP 126/75 (BP Location: Right Arm)   Pulse 82   Temp 97.9 F (36.6 C) (Oral)   Resp 18   Ht 5\' 3"  (1.6 m)   Wt 86.2 kg   LMP 02/16/2021   SpO2 100%   BMI 33.66 kg/m   Physical Exam Vitals and nursing note reviewed. Exam conducted with a chaperone present.  Constitutional:      General: She is not in acute distress.    Appearance: Normal appearance.  HENT:     Head: Normocephalic and atraumatic.     Comments: Evaluation of the oral cavity shows no abscess of the gingival mucosa, no trismus, no tenderness to palpation of any of the teeth.  No obvious signs of dental fracture.  No exposed dentin no exposed pulp.    Right Ear: Tympanic membrane normal.     Left Ear: Tympanic membrane normal.     Nose: No rhinorrhea.  Eyes:  General:        Right eye: No discharge.        Left eye: No discharge.     Conjunctiva/sclera: Conjunctivae normal.  Cardiovascular:     Rate and Rhythm: Normal rate and regular rhythm.  Pulmonary:     Effort: Pulmonary effort is normal. No respiratory distress.     Breath sounds: No stridor.  Abdominal:     General: Abdomen is flat. There is no distension.     Palpations: Abdomen is soft.  Musculoskeletal:        General: No tenderness or signs of injury.  Skin:    General: Skin is warm and dry.  Neurological:     General: No focal deficit present.     Mental Status: She is alert. Mental status is at baseline.     Motor: No weakness.  Psychiatric:        Mood and  Affect: Mood normal.        Behavior: Behavior normal.     ED Results / Procedures / Treatments   Labs (all labs ordered are listed, but only abnormal results are displayed) Labs Reviewed - No data to display  EKG None  Radiology No results found.  Procedures Procedures   Medications Ordered in ED Medications  ketorolac (TORADOL) 15 MG/ML injection 15 mg (has no administration in time range)  oxyCODONE-acetaminophen (PERCOCET/ROXICET) 5-325 MG per tablet 1 tablet (has no administration in time range)    ED Course  I have reviewed the triage vital signs and the nursing notes.  Pertinent labs & imaging results that were available during my care of the patient were reviewed by me and considered in my medical decision making (see chart for details).    MDM Rules/Calculators/A&P                          Patient has sudden onset worsening dental pain.  Is tried multiple home remedies with no success.  Has no signs of deep space infection.  Has no trismus has no significant signs of trauma.  She does have a dentist and can follow-up tomorrow.  But is trying to fall asleep and having issues with pain control.  Pain control provided prescription sent return precautions given. Final Clinical Impression(s) / ED Diagnoses Final diagnoses:  Pain, dental    Rx / DC Orders ED Discharge Orders         Ordered    oxyCODONE-acetaminophen (PERCOCET/ROXICET) 5-325 MG tablet  Every 6 hours PRN        02/19/21 2359           Sabino Donovan, MD 02/20/21 0002
